# Patient Record
Sex: Female | Born: 1950
Health system: Southern US, Community
[De-identification: ages and names within clinical notes are randomized; demographics above are authoritative.]

## PROBLEM LIST (undated history)

## (undated) DIAGNOSIS — M1711 Unilateral primary osteoarthritis, right knee: Secondary | ICD-10-CM

## (undated) DIAGNOSIS — Z87442 Personal history of urinary calculi: Secondary | ICD-10-CM

## (undated) DIAGNOSIS — C801 Malignant (primary) neoplasm, unspecified: Secondary | ICD-10-CM

## (undated) DIAGNOSIS — J189 Pneumonia, unspecified organism: Secondary | ICD-10-CM

## (undated) DIAGNOSIS — K219 Gastro-esophageal reflux disease without esophagitis: Secondary | ICD-10-CM

## (undated) DIAGNOSIS — I4510 Unspecified right bundle-branch block: Secondary | ICD-10-CM

## (undated) DIAGNOSIS — R9431 Abnormal electrocardiogram [ECG] [EKG]: Secondary | ICD-10-CM

## (undated) DIAGNOSIS — I499 Cardiac arrhythmia, unspecified: Secondary | ICD-10-CM

## (undated) DIAGNOSIS — E039 Hypothyroidism, unspecified: Secondary | ICD-10-CM

## (undated) HISTORY — DX: Malignant (primary) neoplasm, unspecified: C80.1

## (undated) HISTORY — PX: POLYPECTOMY: SHX149

## (undated) HISTORY — PX: COLONOSCOPY: SHX174

## (undated) HISTORY — DX: Unilateral primary osteoarthritis, right knee: M17.11

## (undated) HISTORY — DX: Unspecified right bundle-branch block: I45.10

## (undated) HISTORY — DX: Abnormal electrocardiogram (ECG) (EKG): R94.31

## (undated) HISTORY — DX: Gastro-esophageal reflux disease without esophagitis: K21.9

## (undated) HISTORY — PX: KNEE ARTHROPLASTY: SHX992

## (undated) HISTORY — PX: UPPER GASTROINTESTINAL ENDOSCOPY: SHX188

---

## 2000-08-16 ENCOUNTER — Encounter: Admission: RE | Admit: 2000-08-16 | Discharge: 2000-08-16 | Payer: Self-pay | Admitting: Orthopedic Surgery

## 2000-08-16 ENCOUNTER — Encounter: Payer: Self-pay | Admitting: Orthopedic Surgery

## 2001-11-08 ENCOUNTER — Other Ambulatory Visit: Admission: RE | Admit: 2001-11-08 | Discharge: 2001-11-08 | Payer: Self-pay | Admitting: Obstetrics and Gynecology

## 2003-10-04 ENCOUNTER — Emergency Department (HOSPITAL_COMMUNITY): Admission: AD | Admit: 2003-10-04 | Discharge: 2003-10-04 | Payer: Self-pay | Admitting: Family Medicine

## 2004-08-03 HISTORY — PX: ABDOMINAL HYSTERECTOMY: SHX81

## 2005-12-16 ENCOUNTER — Encounter (INDEPENDENT_AMBULATORY_CARE_PROVIDER_SITE_OTHER): Payer: Self-pay | Admitting: *Deleted

## 2005-12-16 ENCOUNTER — Ambulatory Visit (HOSPITAL_COMMUNITY): Admission: RE | Admit: 2005-12-16 | Discharge: 2005-12-17 | Payer: Self-pay | Admitting: Obstetrics and Gynecology

## 2006-03-08 ENCOUNTER — Ambulatory Visit: Payer: Self-pay | Admitting: Gastroenterology

## 2006-05-04 ENCOUNTER — Encounter: Admission: RE | Admit: 2006-05-04 | Discharge: 2006-05-04 | Payer: Self-pay | Admitting: Obstetrics and Gynecology

## 2006-07-14 ENCOUNTER — Ambulatory Visit: Payer: Self-pay | Admitting: Gastroenterology

## 2006-07-22 ENCOUNTER — Ambulatory Visit: Payer: Self-pay | Admitting: Gastroenterology

## 2007-08-04 HISTORY — PX: TOTAL KNEE ARTHROPLASTY: SHX125

## 2007-10-26 ENCOUNTER — Encounter: Admission: RE | Admit: 2007-10-26 | Discharge: 2007-10-26 | Payer: Self-pay | Admitting: Family Medicine

## 2008-02-17 ENCOUNTER — Inpatient Hospital Stay (HOSPITAL_COMMUNITY): Admission: RE | Admit: 2008-02-17 | Discharge: 2008-02-20 | Payer: Self-pay | Admitting: Orthopaedic Surgery

## 2008-11-27 ENCOUNTER — Ambulatory Visit: Payer: Self-pay | Admitting: Licensed Clinical Social Worker

## 2008-12-04 ENCOUNTER — Ambulatory Visit: Payer: Self-pay | Admitting: Licensed Clinical Social Worker

## 2008-12-10 ENCOUNTER — Ambulatory Visit: Payer: Self-pay | Admitting: Licensed Clinical Social Worker

## 2008-12-17 ENCOUNTER — Ambulatory Visit: Payer: Self-pay | Admitting: Licensed Clinical Social Worker

## 2008-12-26 ENCOUNTER — Ambulatory Visit: Payer: Self-pay | Admitting: Licensed Clinical Social Worker

## 2009-01-02 ENCOUNTER — Ambulatory Visit: Payer: Self-pay | Admitting: Licensed Clinical Social Worker

## 2009-01-14 ENCOUNTER — Ambulatory Visit: Payer: Self-pay | Admitting: Licensed Clinical Social Worker

## 2009-01-30 ENCOUNTER — Ambulatory Visit: Payer: Self-pay | Admitting: Licensed Clinical Social Worker

## 2009-02-13 ENCOUNTER — Ambulatory Visit: Payer: Self-pay | Admitting: Licensed Clinical Social Worker

## 2009-05-24 ENCOUNTER — Encounter: Admission: RE | Admit: 2009-05-24 | Discharge: 2009-05-24 | Payer: Self-pay | Admitting: Internal Medicine

## 2010-02-24 ENCOUNTER — Encounter: Admission: RE | Admit: 2010-02-24 | Discharge: 2010-02-24 | Payer: Self-pay | Admitting: Internal Medicine

## 2010-06-05 ENCOUNTER — Encounter: Admission: RE | Admit: 2010-06-05 | Discharge: 2010-06-05 | Payer: Self-pay | Admitting: Internal Medicine

## 2010-06-20 ENCOUNTER — Encounter: Admission: RE | Admit: 2010-06-20 | Discharge: 2010-06-20 | Payer: Self-pay | Admitting: Internal Medicine

## 2010-12-16 NOTE — Op Note (Signed)
[name], [name]               ACCOUNT NO.:  [id number]   MEDICAL RECORD NO.:  [id number]          PATIENT TYPE:  INP   LOCATION:  5031                         FACILITY:  [facility]   PHYSICIAN:  Renee Haynes, M.D.    DATE OF BIRTH:  July 15, 1951   DATE OF PROCEDURE:  02/17/2008  DATE OF DISCHARGE:                               OPERATIVE REPORT   PREOPERATIVE DIAGNOSIS:  Right knee osteoarthritis.   POSTOPERATIVE DIAGNOSIS:  Right knee osteoarthritis.   PROCEDURE:  Right total knee arthroplasty, computer assist.   SURGEON:  Renee C. Ophelia Charter, MD   ASSISTANT:  Wende Neighbors, PA   ANESTHESIA:  GOT plus Marcaine skin local.   DRAINS:  One Hemovac.   COMPONENTS:  Implanted DePuy sigma #3 femur with lugs 2.5 tibia, 10-mm  insert, 35-mm all poly patella cemented, computer assist.   PROCEDURE:  After induction of general anesthesia with orotracheal  intubation, the patient had preoperative femoral nerve block.  Proximal  tourniquet was applied on lateral post heel bump for the knee  positioning in 90-degree flexion.  Standard prep and drape was performed  with DuraPrep.  Preoperative Ancef was given.  Extremity sheets, drapes,  split sheets, sterile skin marker, Betadine, and Biodrape was applied  and surgical time-out checklist was completed.  Midline incision was  made with medial parapatellar true retinacular incision.  Patella was  cut first removing 10 mm of bone with oscillating saw.  There were large  osteophytes on the femur and a large channel into the medial femoral  condyle with a prominent tied Renee.  Meniscus were resected.  There was  a very large medial parameniscal cyst present and this was opened and  ganglion fluid was removed.  Cyst as present at the lateral meniscus and  there was some calcification at the posterior horn of the medial  meniscus.  Placement of computer pins were performed with the femoral  pins placed in the incision and tibial pins outside.  These  taken  through range of motion for center of the hip and then sequential  imaging of the femur and then tibia curetting the models.  Ten mm were  resected off of the femur and then sizing for a #3.  Chamfer cuts were  made.  Box cut was made later.  The tibia was repaired and sized for 2.5  with computer orientation for appropriate cuts.  Keel cuts were made and  then trials inserted.  There was slightly more tightness on the medial  than lateral side.  Osteophytes were removed off the posterior aspect of  the femur with three-quarter curved osteotome and some of the posterior  capsule in the medial aspect was released.  The collateral ligaments  were saved and PCL was completely resected.  Cement was vacuum mixed.  Holes were drilled for the patella as well as the lug holes of the  femoral trial.  Tibia was cemented first.  Excessive cement was removed  with the Freer.  Next, the femur was cemented.  Insert placed and then  the patella clamped.  All excessive cements have  been removed.  There  was acute synovitis present throughout the knee, which was hemorrhagic  in appearance.  After cement was hard at 15 minutes, tourniquet was  deflated.  Hemostasis was obtained.  Hemovac drain was placed used an  trocar exiting superolaterally.  Deep retinaculum was closed with  interrupted figure-of-eight nonabsorbable sutures #1 and then 2-0 Vicryl  was placed in superficial retinaculum and  subcutaneous tissue.  Subcuticular skin closure,  tincture of Benzoin,  Steri-Strips, Marcaine infiltration, postop dressing, and knee  immobilizer was applied.  The patient tolerated the procedure well and  was transferred to recovery room in stable condition.      Renee Haynes, M.D.  Electronically Signed     MCY/MEDQ  D:  02/17/2008  T:  02/18/2008  Job:  [id number]

## 2010-12-19 NOTE — Op Note (Signed)
NAME:  Renee Haynes, Renee Haynes               ACCOUNT NO.:  [id number]   MEDICAL RECORD NO.:  [id number]          PATIENT TYPE:  AMB   LOCATION:  SDC                           FACILITY:  [facility]   PHYSICIAN:  Sherry A. Dickstein, M.D.DATE OF BIRTH:  Mar 24, 1951   DATE OF PROCEDURE:  12/16/2005  DATE OF DISCHARGE:                                 OPERATIVE REPORT   PREOPERATIVE DIAGNOSES:  1.  Cystocele.  2.  Vaginal prolapse.   POSTOPERATIVE DIAGNOSES:  1.  Cystocele.  2.  Vaginal prolapse.   PROCEDURE:  1.  Total vaginal hysterectomy.  2.  Anterior repair.  3.  Perineorrhaphy.   SURGEON:  Sherry A. Rosalio Macadamia, M.D. and Richardean Sale, M.D.   ANESTHESIA:  General   INDICATIONS:  This is a 60 year old G3, P 2-0-1-2 woman who has had a  significant vaginal prolapse over the past few years. This has been getting  worse such that the patient has a lot of vaginal pressure whenever she is  standing or lifting.  The patient is feeling her cervix and bladder  dropping.  The patient requests surgical intervention, at this time.  The  patient was found to have a third-degree cervical prolapse and a second-  degree cystocele.  Because of this, the patient is brought to the operating  room for vaginal hysterectomy and anterior repair.  The patient also  requests taking of the vaginal tissues at the time of surgery.   FINDINGS:  Normal sized anteflexed uterus, second-degree cystocele, third-  degree cervical prolapse.   DESCRIPTION OF PROCEDURE:  The patient is brought into the operating room,  given adequate general anesthesia; she was placed in dorsal lithotomy  position.  Her perineum and vagina were washed with Betadine.  She was  draped in a sterile fashion.  A weighted speculum was placed within the  vagina.  The cervix was grasped with [place] tenaculum.  The cervix was  infiltrated with 1% Xylocaine with 1:100,000 epinephrine.  The cervix was  circumcised and the vaginal mucosa was dissected  off of it. the posterior  cul-de-sac was identified and entered sharply.  The peritoneum was  identified.  The posterior cuff was opened.  Uterosacral ligaments were  clamped, cut, and suture ligated with #0 Vicryl ligature.  The posterior  cuff was then reefed with #0 Vicryl running locked stitch for adequate  hemostasis.   The beginning of the cardinal ligaments were clamped with ligatures and  cauterized x3 and cut.  The anterior cul-de-sac was identified with blunt  dissection to dissect the bladder off of the cervix.  The peritoneum was  identified and entered sharply.  The retractor was placed within this space.  Then, on alternating sides, the cardinal ligaments including the uterine  arteries were clamped and cauterized with LigaSure; each time the area was  cauterized x3 before it was cut.  The uterus was removed in this fashion.  There was a small amount of bleeding on the left side near the cardinal  ligaments.  This was suture ligated with #0 Vicryl figure-of-eight stitches.  Adequate hemostasis was obtained.  The perineum was identified and a suture was placed in a pursestring stitch  with #0 Vicryl.  This was not tied at this time.  The cul-de-sac stitch was  taken between the uterosacral ligaments, and across the posterior peritoneum  with #0 Vicryl to prevent a future enterocele.  This was tied; and then the  peritoneal stitch was then tied.  The vaginal cuff was then closed with #0  Vicryl in figure-of-eight stitches.  This was only closed up to just below  the uterosacral ligaments.  The anterior repair was then performed by  placing Allis clamps across the vaginal mucosa.  The vaginal mucosa was then  dissected off of the bladder with blunt and sharp dissection up to just  below the urethra.  A Kelly plication stitch was taken below the urethra  with 2-0 Vicryl in a mattress-type stitch for support of the urethra.   Attention was placed to make sure that the stitch  was not to through the  vaginal mucosa.  This was tied down for support of the urethra.  During the  surgery, a Foley catheter had been placed; and the Foley catheter was in  place when this stitch was tied down to make sure that there was not too  much tension across the urethra.  The bladder was then supported by taking 2-  0 Vicryl mattress-type stitches across the endopelvic fascia for support of  the bladder.  A shelf of tissue was then developed across the bladder for  support in this fashion.  The excess vaginal tissue was then dissected free  and the vaginal tissue was closed with #0 Vicryl in figure-of-eight  stitches.  The remaining vaginal tissues were closed throughout the vagina  in these figure-of-eight stitches; and the vaginal cuff was completely  closed.  The uterosacral stitches were tied together.   A small perineorrhaphy was performed by taking Allis clamps on the perineum,  cutting approximately 1.5 cm of tissue off. An incision was made across the  perineum in a triangular shape.  This tissue was dissected free.  A small  amount of tissue was dissected up underneath the vaginal mucosa.  Once these  tissues were dissected free, a #0 Vicryl stitch was taken into the levator  ani muscles to pull the tissues together.  This close to the vaginal opening  well.  The vaginal mucosa was then closed using 2-0 chromic in a  subcutaneous subcuticular closure.  Adequate hemostasis was present.  All  instruments had been removed from the vagina.  The patient was taken out of  the dorsal lithotomy position.  She was awakened.  She was extubated.  She  was moved from the operating room table to a stretcher in stable condition.  Complications were none.  Estimated blood loss 100 mL.  Specimen is uterus.      Sherry A. Rosalio Macadamia, M.D.  Electronically Signed    SAD/MEDQ  D:  12/16/2005  T:  12/16/2005  Job:  [id number]

## 2010-12-19 NOTE — Discharge Summary (Signed)
NAMEVALETTA, MULROY               ACCOUNT NO.:  [id number]   MEDICAL RECORD NO.:  [id number]          PATIENT TYPE:  INP   LOCATION:  5031                         FACILITY:  [facility]   PHYSICIAN:  Mark C. Ophelia Charter, M.D.    DATE OF BIRTH:  [birth year]   DATE OF ADMISSION:  02/17/2008  DATE OF DISCHARGE:  02/20/2008                               DISCHARGE SUMMARY   ADMISSION DIAGNOSES:  1. Osteoarthritis right knee.  2. Hypothyroidism   DISCHARGE DIAGNOSES:  1. Osteoarthritis right knee.  2. Hypothyroidism.  3. Posthemorrhagic anemia.   PROCEDURE:  On February 17, 2008, the patient underwent right total knee  arthroplasty computer-assisted performed by Dr. Ophelia Charter, assisted by  Maud Deed, [facility] under general anesthesia.   CONSULTATIONS:  None.   BRIEF HISTORY:  The patient is a 60 year old female with chronic and  progressive right knee pain.  Radiographs showing end-stage degenerative  joint disease of the right knee with near bone-on-bone deformity of the  medial compartment.  She had no relief with conservative treatment  including intra-articular steroid injections as well as viscous  supplementation.  She wished to proceed with surgical intervention and  was admitted for the procedure as stated above.   BRIEF HOSPITAL COURSE:  The patient tolerated the procedure under  general anesthesia without complications.  Postoperatively, she was  placed on Coumadin for DVT prophylaxis.  Adjustments in Coumadin dose  made according to daily protimes by the pharmacist at [facility].  INR on admission 0.9, INR at discharge was 1.8.  The patient was placed  on PCA analgesics initially and weaned to p.o. analgesics without  difficulty.  Foley catheter was discontinued on the second postoperative  day and she was able to void without difficulty.  Hemovac drain  discontinued on the first postoperative day and dressing changes done  daily thereafter.  No signs of infection during the hospital  stay of the  knee wound.  The patient utilized CPM machine for passive range of  motion and also received physical therapy for active range of motion,  stretching and strengthening exercises.  She was allowed weightbearing  as tolerated on the right lower extremity.  The patient was able to  ambulate as much as 100 feet with a rolling walker with minimal  assistance.  CPM machine was as high as 0 to 65 degrees.  The patient  demonstrated ability to go up and downstairs during the hospital stay as  well.  Admission labs showed hemoglobin 14.3, hematocrit 41.9.  Postoperatively, hemoglobin dropped to lowest value of 10.8 with  hematocrit 31.1.  Chemistry studies on admission within normal limits  with exception of glucose 101.  Repeat chemistries postoperatively with  values normal with exception of glucose 140.  Hemoglobin A1c 5.6 on February 18, 2008.  Urinalysis on admission with trace leukocyte esterase, few  epithelial cells and 0-2 WBCs per high power field.  Postoperative x-ray  of the right knee showed satisfactory appearance of right total knee  replacement.  EKG on admission sinus bradycardia, T-wave abnormality  consider anterior ischemia confirmed by Dr. Eldridge Dace.  PLAN:  The patient was discharged to her home.  Arrangements made for  appropriate durable medical equipment including CPM machine which she  will utilize as much as 8 hours daily.  Also a rolling walker and  elevated toilet seat made available for her.  She will receive home  health physical therapy for ambulation and gait training, weightbearing  as tolerated, as well as range of motion, stretching and strengthening  exercises of the right knee.  Dressing change to be done daily at home.  She will continue to use ice on the knee.  She will continue to use her  knee immobilizer until she is able to do a straight leg raise.  Follow  up with Dr. Ophelia Charter in 2 weeks.   Medications at discharge include  1. Tylox 1 to 2  every 4-6 hours as needed for pain.  2. Coumadin 1 mg daily.  3. Robaxin 500 mg one every 8 hours as needed for spasm.  She will      continue on Coumadin 1 mg daily with no need for daily pro- times.      Anticipate use of Coumadin for 4 weeks.   She will resume home medications as taken prior to admission with the  exception of Advil.  Medication reconciliation form given to the patient  with instructions.  All questions encouraged and answered.   CONDITION ON DISCHARGE:  Stable.      Wende Neighbors, P.A.      Mark C. Ophelia Charter, M.D.  Electronically Signed    SMV/MEDQ  D:  03/14/2008  T:  03/15/2008  Job:  [id number]

## 2011-03-18 ENCOUNTER — Other Ambulatory Visit (HOSPITAL_COMMUNITY): Payer: Managed Care, Other (non HMO)

## 2011-03-18 MED ORDER — GENTAMICIN IN SALINE 1.6-0.9 MG/ML-% IV SOLN: 80.0000 mg | Freq: Once | INTRAVENOUS | Status: DC

## 2011-03-19 ENCOUNTER — Encounter (HOSPITAL_COMMUNITY): Payer: Self-pay

## 2011-03-19 ENCOUNTER — Other Ambulatory Visit: Payer: Self-pay

## 2011-03-19 ENCOUNTER — Encounter (HOSPITAL_COMMUNITY)
Admission: RE | Admit: 2011-03-19 | Discharge: 2011-03-19 | Disposition: A | Discharge status: Home / Self Care | Payer: Managed Care, Other (non HMO) | Source: Ambulatory Visit | Attending: Obstetrics & Gynecology | Admitting: Obstetrics & Gynecology

## 2011-03-19 HISTORY — DX: Hypothyroidism, unspecified: E03.9

## 2011-03-19 MED ORDER — CLINDAMYCIN PHOSPHATE 900 MG/50ML IV SOLN
900.0000 mg | Freq: Once | INTRAVENOUS | Status: DC
  Filled 2011-03-19: qty 50

## 2011-03-19 MED ORDER — GENTAMICIN SULFATE 40 MG/ML IJ SOLN
120.0000 mg | Freq: Once | INTRAVENOUS | Status: DC
  Filled 2011-03-19: qty 3

## 2011-03-19 NOTE — Pre-Procedure Instructions (Signed)
EKG evaluated by Dr. Malen Gauze, MD., case to be canceled until cardiac clearance obtained. Pt informed, Dr. Camillia Herter office informed [facility]).

## 2011-03-19 NOTE — Patient Instructions (Addendum)
20 Renee Haynes  03/19/2011   Your procedure is scheduled on:  03/20/11  Enter through the Main Entrance of [facility] at 900 AM.  Pick up the phone at the desk and dial [phone].   Call this number if you have problems the morning of surgery: [phone]   Remember:   Do not eat food:After Midnight.  Do not drink clear liquids: After Midnight.  Take these medicines the morning of surgery with A SIP OF WATER: Synthroid    Do not wear jewelry, make-up or nail polish.  Do not wear lotions, powders, or perfumes. You may wear deodorant.  Do not shave 48 hours prior to surgery.  Do not bring valuables to the hospital.  Contacts, dentures or bridgework may not be worn into surgery.  Leave suitcase in the car. After surgery it may be brought to your room.  For patients admitted to the hospital, checkout time is 11:00 AM the day of discharge.   Patients discharged the day of surgery will not be allowed to drive home.  Name and phone number of your driver: NA  Special Instructions: CHG Shower Use Special Wash: 1/2 bottle night before surgery and 1/2 bottle morning of surgery.   Please read over the following fact sheets that you were given: Care and Recovery After Surgery

## 2011-03-20 ENCOUNTER — Encounter (HOSPITAL_COMMUNITY): Admission: RE | Payer: Self-pay | Source: Ambulatory Visit

## 2011-03-20 ENCOUNTER — Ambulatory Visit (HOSPITAL_COMMUNITY)
Admission: RE | Admit: 2011-03-20 | Payer: Managed Care, Other (non HMO) | Source: Ambulatory Visit | Admitting: Obstetrics & Gynecology

## 2011-03-20 SURGERY — ANTERIOR (CYSTOCELE) AND POSTERIOR REPAIR (RECTOCELE)
Anesthesia: General

## 2011-03-23 ENCOUNTER — Encounter: Payer: Self-pay | Admitting: Cardiology

## 2011-03-25 ENCOUNTER — Encounter: Payer: Self-pay | Admitting: Cardiology

## 2011-03-25 ENCOUNTER — Ambulatory Visit (INDEPENDENT_AMBULATORY_CARE_PROVIDER_SITE_OTHER): Payer: Managed Care, Other (non HMO) | Admitting: Cardiology

## 2011-03-25 DIAGNOSIS — R9431 Abnormal electrocardiogram [ECG] [EKG]: Secondary | ICD-10-CM

## 2011-03-25 DIAGNOSIS — R0989 Other specified symptoms and signs involving the circulatory and respiratory systems: Secondary | ICD-10-CM

## 2011-03-25 DIAGNOSIS — R0609 Other forms of dyspnea: Secondary | ICD-10-CM

## 2011-03-25 NOTE — Patient Instructions (Signed)
Start aspirin 81mg  daily--this should be enteric coated.  Schedule an appointment for a stress myoview. See instruction sheet.  Schedule an appointment for an echocardiogram.  Schedule an appointment with Dr Shirlee Latch in about 2 weeks after the testing has been completed.

## 2011-03-26 ENCOUNTER — Ambulatory Visit (HOSPITAL_COMMUNITY): Payer: Managed Care, Other (non HMO) | Attending: Cardiology | Admitting: Radiology

## 2011-03-26 DIAGNOSIS — I079 Rheumatic tricuspid valve disease, unspecified: Secondary | ICD-10-CM | POA: Insufficient documentation

## 2011-03-26 DIAGNOSIS — I379 Nonrheumatic pulmonary valve disorder, unspecified: Secondary | ICD-10-CM | POA: Insufficient documentation

## 2011-03-26 DIAGNOSIS — R0609 Other forms of dyspnea: Secondary | ICD-10-CM | POA: Insufficient documentation

## 2011-03-26 DIAGNOSIS — R5383 Other fatigue: Secondary | ICD-10-CM | POA: Insufficient documentation

## 2011-03-26 DIAGNOSIS — I498 Other specified cardiac arrhythmias: Secondary | ICD-10-CM | POA: Insufficient documentation

## 2011-03-26 DIAGNOSIS — R9431 Abnormal electrocardiogram [ECG] [EKG]: Secondary | ICD-10-CM | POA: Insufficient documentation

## 2011-03-26 DIAGNOSIS — R0989 Other specified symptoms and signs involving the circulatory and respiratory systems: Secondary | ICD-10-CM | POA: Insufficient documentation

## 2011-03-26 DIAGNOSIS — R5381 Other malaise: Secondary | ICD-10-CM | POA: Insufficient documentation

## 2011-03-26 DIAGNOSIS — R42 Dizziness and giddiness: Secondary | ICD-10-CM | POA: Insufficient documentation

## 2011-03-26 DIAGNOSIS — R0602 Shortness of breath: Secondary | ICD-10-CM

## 2011-03-26 MED ORDER — TECHNETIUM TC 99M TETROFOSMIN IV KIT
11.0000 | PACK | Freq: Once | INTRAVENOUS | Status: AC | PRN
  Administered 2011-03-26: 11 via INTRAVENOUS

## 2011-03-26 MED ORDER — TECHNETIUM TC 99M TETROFOSMIN IV KIT
33.0000 | PACK | Freq: Once | INTRAVENOUS | Status: AC | PRN
  Administered 2011-03-26: 33 via INTRAVENOUS
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
## 2011-03-26 NOTE — Assessment & Plan Note (Signed)
See above discussion

## 2011-03-26 NOTE — Assessment & Plan Note (Signed)
Patient's ECG was quite abnormal from yesterday with prominent anteroseptal T wave inversions and tall R waves in V1 and V2 suggestive of possible posterior MI.  Today's ECG shows significant improvement in the T wave inversions, suggesting lability. Patient is planning to undergo bladder surgery in the future.  She denies chest pain but has some dyspnea with moderate exertion.  Her ECG is concerning.  It is possible that it represents coronary disease, though she has minimal risk factors.  Another consideration would be a stress (takotsubo-type) cardiomyopathy, which could also give this pattern of ECG changes.  She has had some quite severe stress recently surrounding her divorce.   - Echo to assess LV systolic function.  - ETT-myoview to assess for ischemia.  - She should take ASA 81 mg daily until ischemia is ruled out.

## 2011-03-26 NOTE — Progress Notes (Signed)
[facility] SITE 3 NUCLEAR MED [address] [place] [address] [phone]  Cardiology Nuclear Med Study  Renee Haynes is a 60 y.o. female [id number] 1950-10-30   Nuclear Med Background Indication for Stress Test:  Evaluation for Ischemia and Abnormal EKG History:  No previous documented CAD Cardiac Risk Factors: none  Symptoms:  Dizziness, DOE and Fatigue   Nuclear Pre-Procedure Caffeine/Decaff Intake:  None NPO After: 7:30pm   Lungs:  clear IV 0.9% NS with Angio Cath:  20g  IV Site: R Antecubital  IV Started by:  Bonnita Levan, RN  Chest Size (in):  40 Cup Size: C  Height: 5\' 5"  (1.651 m)  Weight:  172 lb (78.019 kg)  BMI:  Body mass index is 28.62 kg/(m^2). Tech Comments:  N/A    Nuclear Med Study 1 or 2 day study: 1 day  Stress Test Type:  Stress  Reading MD: Cassell Clement, MD  Order Authorizing Provider:  D.McLean  Resting Radionuclide: Technetium 59m Tetrofosmin  Resting Radionuclide Dose: 11.0 mCi   Stress Radionuclide:  Technetium 71m Tetrofosmin  Stress Radionuclide Dose: 33.0 mCi           Stress Protocol Rest HR: 52 Stress HR: 141  Rest BP: 95/71 Stress BP: 176/91  Exercise Time (min): 7:30 METS: 9.30   Predicted Max HR: 158 bpm % Max HR: 89.24 bpm Rate Pressure Product: 41324   Dose of Adenosine (mg):  n/a Dose of Lexiscan: n/a mg  Dose of Atropine (mg): n/a Dose of Dobutamine: n/a mcg/kg/min (at max HR)  Stress Test Technologist: Milana Na, EMT-P  Nuclear Technologist:  Doyne Keel, CNMT     Rest Procedure:  Myocardial perfusion imaging was performed at rest 45 minutes following the intravenous administration of Technetium 38m Tetrofosmin. Rest ECG: Sinus Bradycardia  Stress Procedure:  The patient exercised for 7:30.  The patient stopped due to sob, fatigue, and denied any chest pain.  There were no significant ST-T wave changes, + sob, and fatigue.  Technetium 63m Tetrofosmin was injected at peak exercise and  myocardial perfusion imaging was performed after a brief delay. Stress ECG: With stress the anterior wall resting T wave abnormalities improve, then recur during recovery.  No ischemic ST depression.  QPS Raw Data Images:  Normal; no motion artifact; normal heart/lung ratio. Stress Images:  Normal homogeneous uptake in all areas of the myocardium. Rest Images:  Normal homogeneous uptake in all areas of the myocardium. Subtraction (SDS):  No evidence of ischemia. Transient Ischemic Dilatation (Normal <1.22):  0.94 Lung/Heart Ratio (Normal <0.45):  0.34  Quantitative Gated Spect Images QGS EDV:  64 ml QGS ESV:  13 ml QGS cine images:  NL LV Function; NL Wall Motion QGS EF: 80%  Impression Exercise Capacity:  Good exercise capacity. BP Response:  Normal blood pressure response. Clinical Symptoms:  No chest pain. ECG Impression:  No significant ST segment change suggestive of ischemia. Comparison with Prior Nuclear Study: No previous nuclear study performed  Overall Impression:  Normal stress nuclear study.  No ischemia.  Excellent LV systolic function.  Cassell Clement

## 2011-03-30 ENCOUNTER — Encounter: Payer: Self-pay | Admitting: *Deleted

## 2011-04-13 ENCOUNTER — Ambulatory Visit (INDEPENDENT_AMBULATORY_CARE_PROVIDER_SITE_OTHER): Payer: Managed Care, Other (non HMO) | Admitting: Cardiology

## 2011-04-13 ENCOUNTER — Encounter: Payer: Self-pay | Admitting: Cardiology

## 2011-04-13 VITALS — BP 119/77 | HR 59 | Ht 66.0 in | Wt 175.0 lb

## 2011-04-13 DIAGNOSIS — R9431 Abnormal electrocardiogram [ECG] [EKG]: Secondary | ICD-10-CM

## 2011-04-13 NOTE — Patient Instructions (Signed)
You do not need to schedule a follow-up appointment.
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
## 2011-04-15 NOTE — Assessment & Plan Note (Signed)
Renee Haynes's heart appears structurally normal by echo and there is no evidence for ischemia or infarction by myoview.  Though her ECG is abnormal, I suspect that this may be her baseline.  No further workup necessary.  Continue management of risk factors.

## 2011-05-01 LAB — APTT: aPTT: 25

## 2011-05-01 LAB — BASIC METABOLIC PANEL
BUN: 9
Chloride: 108
GFR calc Af Amer: >60
GFR calc non Af Amer: >60
Potassium: 4.3
Sodium: 138

## 2011-05-01 LAB — CBC
HCT: 34.4 — ABNORMAL LOW
HCT: 41.9
Hemoglobin: 10.8 — ABNORMAL LOW
Hemoglobin: 14.3
MCV: 86.8
Platelets: 118 — ABNORMAL LOW
Platelets: 163
RBC: 3.96
RDW: 14.4
WBC: 4.5
WBC: 6
WBC: 8.6

## 2011-05-01 LAB — DIFFERENTIAL
Basophils Relative: 1
Eosinophils Absolute: 0.1
Lymphocytes Relative: 31
Monocytes Absolute: 0.3
Monocytes Relative: 7

## 2011-05-01 LAB — URINE MICROSCOPIC-ADD ON

## 2011-05-01 LAB — COMPREHENSIVE METABOLIC PANEL
AST: 18
Albumin: 4.2
Calcium: 10
Chloride: 107
Glucose, Bld: 101 — ABNORMAL HIGH
Potassium: 4.5
Total Protein: 6.2

## 2011-05-01 LAB — URINALYSIS, ROUTINE W REFLEX MICROSCOPIC
Nitrite: NEGATIVE
Protein, ur: NEGATIVE
Urobilinogen, UA: 1
pH: 6.5

## 2011-05-01 LAB — PROTIME-INR
INR: 1.5
INR: 1.8 — ABNORMAL HIGH
Prothrombin Time: 13.5
Prothrombin Time: 22.1 — ABNORMAL HIGH

## 2011-05-01 LAB — HEMOGLOBIN A1C
Hgb A1c MFr Bld: 5.6
Mean Plasma Glucose: 122

## 2011-05-28 ENCOUNTER — Encounter (HOSPITAL_COMMUNITY): Payer: Self-pay

## 2011-05-28 ENCOUNTER — Encounter (HOSPITAL_COMMUNITY)
Admission: RE | Admit: 2011-05-28 | Discharge: 2011-05-28 | Disposition: A | Discharge status: Home / Self Care | Payer: Managed Care, Other (non HMO) | Source: Ambulatory Visit | Attending: Obstetrics & Gynecology | Admitting: Obstetrics & Gynecology

## 2011-05-28 LAB — CBC
MCH: 30.1 pg (ref 26.0–34.0)
MCHC: 34.4 g/dL (ref 30.0–36.0)
Platelets: 172 10*3/uL (ref 150–400)

## 2011-05-28 NOTE — Patient Instructions (Signed)
   Your procedure is scheduled on:06/10/11  Enter through the Main Entrance of [facility] at:0600 Pick up the phone at the desk and dial [phone] and inform us of your arrival.  Please call this number if you have any problems the morning of surgery: [phone]  Remember: Do not eat food after midnight:Tuesday Do not drink clear liquids after:midnight Tuesday Take these medicines the morning of surgery with a SIP OF WATER: Synthroid  Do not wear jewelry, make-up, or FINGER nail polish Do not wear lotions, powders, or perfumes.  You may wear deodorant. Do not shave 48 hours prior to surgery. Do not bring valuables to the hospital.  Leave suitcase in the car. After Surgery it may be brought to your room. For patients being admitted to the hospital, checkout time is 11:00am the day of discharge.  Patients discharged on the day of surgery will not be allowed to drive home.   Name and phone number of your driver:sonMordecai Maes- [phone]  Remember to use your hibiclens as instructed.Please shower with 1/2 bottle the evening before your surgery and the other 1/2 bottle the morning of surgery.

## 2011-05-29 ENCOUNTER — Other Ambulatory Visit: Payer: Self-pay | Admitting: Obstetrics & Gynecology

## 2011-06-09 MED ORDER — GENTAMICIN SULFATE 40 MG/ML IJ SOLN
INTRAVENOUS | Status: AC
  Administered 2011-06-10: 100 mL via INTRAVENOUS
  Filled 2011-06-09: qty 2.5

## 2011-06-09 NOTE — H&P (Addendum)
Renee Haynes is an 60 y.o. female. TVH and AP repair in 2007. 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 Here for rectocele repair. Prefers not to use any synthetic mesh.  G2P2, single at present. FamHx- breast cancer. Pt denies breast complaints, needs more current mammogram.   No LMP recorded. Patient is postmenopausal., s/p hysterectomy    Past Medical History  Diagnosis Date  . Hypothyroidism   . Abnormal EKG     recent w/u and neg echo  . Osteoarthritis of right knee     Past Surgical History  Procedure Date  . Abdominal hysterectomy 2006  . Joint replacement 2009    knee  . Knee arthroplasty     right    No family history on file.  Social History:  reports that she has never smoked. She does not have any smokeless tobacco history on file. She reports that she drinks alcohol. She reports that she does not use illicit drugs.  Allergies:  Allergies  Allergen Reactions  . Penicillins Cross Reactors Rash  . Sulfa Drugs Cross Reactors Rash    No prescriptions prior to admission    ROS  neg  There were no vitals taken for this visit. Physical Exam Physical exam:  A&O x 3, no acute distress. Pleasant HEENT neg, no thyromegaly Lungs CTA bilat CV S1S2 normal Abdo soft,non tender, non acute Extr no edema/ tenderness Pelvic s/p hysterectomy. Stage 3 rectocele noted , atrophic vaginitis.   No results found for this or any previous visit (from the past 24 hour(s)).  No results found.  Assessment/Plan: Posterior colpo-perineorrhaphy, avoid mesh use. Risks/complications including infection, bleeding, damage to rectum and recurrence of prolapse reviewed, esp since her job requites flying, constant lifting, patients understands and gives informed consent.   Travoris Bushey R 06/09/2011, 10:17 PM  Update--- H&P reviewed on 06/10/11 at 7.30 am, no new findings. Surgery reviewed,  consent obtained, patient agrees. No new changes. --V.Damyiah Moxley, MD 7.30 am, 06/10/11.

## 2011-06-10 ENCOUNTER — Encounter (HOSPITAL_COMMUNITY): Payer: Self-pay | Admitting: *Deleted

## 2011-06-10 ENCOUNTER — Ambulatory Visit (HOSPITAL_COMMUNITY): Payer: Managed Care, Other (non HMO) | Admitting: Anesthesiology

## 2011-06-10 ENCOUNTER — Encounter (HOSPITAL_COMMUNITY)
Admission: RE | Disposition: A | Discharge status: Home / Self Care | Payer: Self-pay | Source: Ambulatory Visit | Attending: Obstetrics & Gynecology

## 2011-06-10 ENCOUNTER — Encounter (HOSPITAL_COMMUNITY): Payer: Self-pay | Admitting: Anesthesiology

## 2011-06-10 ENCOUNTER — Ambulatory Visit (HOSPITAL_COMMUNITY)
Admission: RE | Admit: 2011-06-10 | Discharge: 2011-06-10 | Disposition: A | Discharge status: Home / Self Care | Payer: Managed Care, Other (non HMO) | Source: Ambulatory Visit | Attending: Obstetrics & Gynecology | Admitting: Obstetrics & Gynecology

## 2011-06-10 DIAGNOSIS — N993 Prolapse of vaginal vault after hysterectomy: Secondary | ICD-10-CM | POA: Insufficient documentation

## 2011-06-10 DIAGNOSIS — Z01812 Encounter for preprocedural laboratory examination: Secondary | ICD-10-CM | POA: Insufficient documentation

## 2011-06-10 DIAGNOSIS — Z01818 Encounter for other preprocedural examination: Secondary | ICD-10-CM | POA: Insufficient documentation

## 2011-06-10 DIAGNOSIS — N816 Rectocele: Secondary | ICD-10-CM | POA: Diagnosis present

## 2011-06-10 HISTORY — PX: RECTOCELE REPAIR: SHX761

## 2011-06-10 SURGERY — COLPORRHAPHY, POSTERIOR, FOR RECTOCELE REPAIR
Site: Vagina | Wound class: Clean Contaminated

## 2011-06-10 MED ORDER — MENTHOL 3 MG MT LOZG: 1.0000 | LOZENGE | OROMUCOSAL | Status: DC | PRN

## 2011-06-10 MED ORDER — PROMETHAZINE HCL 25 MG/ML IJ SOLN: 6.2500 mg | INTRAMUSCULAR | Status: DC | PRN

## 2011-06-10 MED ORDER — FENTANYL CITRATE 0.05 MG/ML IJ SOLN
INTRAMUSCULAR | Status: DC | PRN
  Administered 2011-06-10: 100 ug via INTRAVENOUS
  Administered 2011-06-10: 25 ug via INTRAVENOUS

## 2011-06-10 MED ORDER — LACTATED RINGERS IV SOLN
INTRAVENOUS | Status: DC
  Administered 2011-06-10 (×3): via INTRAVENOUS
  Administered 2011-06-10: 1000 mL via INTRAVENOUS

## 2011-06-10 MED ORDER — PROPOFOL 10 MG/ML IV EMUL
INTRAVENOUS | Status: DC | PRN
  Administered 2011-06-10: 150 mg via INTRAVENOUS

## 2011-06-10 MED ORDER — GLYCOPYRROLATE 0.2 MG/ML IJ SOLN
INTRAMUSCULAR | Status: AC
  Filled 2011-06-10: qty 1

## 2011-06-10 MED ORDER — LIDOCAINE HCL (CARDIAC) 20 MG/ML IV SOLN
INTRAVENOUS | Status: DC | PRN
  Administered 2011-06-10: 80 mg via INTRAVENOUS

## 2011-06-10 MED ORDER — ESTRADIOL 0.1 MG/GM VA CREA
TOPICAL_CREAM | VAGINAL | Status: DC | PRN
  Administered 2011-06-10: 1 via VAGINAL

## 2011-06-10 MED ORDER — FENTANYL CITRATE 0.05 MG/ML IJ SOLN: 25.0000 ug | INTRAMUSCULAR | Status: DC | PRN

## 2011-06-10 MED ORDER — EPHEDRINE SULFATE 50 MG/ML IJ SOLN
INTRAMUSCULAR | Status: AC
  Filled 2011-06-10: qty 1

## 2011-06-10 MED ORDER — MIDAZOLAM HCL 5 MG/5ML IJ SOLN
INTRAMUSCULAR | Status: DC | PRN
  Administered 2011-06-10: 1 mg via INTRAVENOUS

## 2011-06-10 MED ORDER — ACETAMINOPHEN 325 MG PO TABS: 325.0000 mg | ORAL_TABLET | ORAL | Status: DC | PRN

## 2011-06-10 MED ORDER — ALUM & MAG HYDROXIDE-SIMETH 200-200-20 MG/5ML PO SUSP: 30.0000 mL | ORAL | Status: DC | PRN

## 2011-06-10 MED ORDER — FENTANYL CITRATE 0.05 MG/ML IJ SOLN
INTRAMUSCULAR | Status: AC
  Filled 2011-06-10: qty 5

## 2011-06-10 MED ORDER — PROPOFOL 10 MG/ML IV EMUL
INTRAVENOUS | Status: AC
  Filled 2011-06-10: qty 20

## 2011-06-10 MED ORDER — OXYCODONE-ACETAMINOPHEN 5-325 MG PO TABS: 1.0000 | ORAL_TABLET | ORAL | Status: DC | PRN

## 2011-06-10 MED ORDER — MIDAZOLAM HCL 2 MG/2ML IJ SOLN
INTRAMUSCULAR | Status: AC
  Filled 2011-06-10: qty 2

## 2011-06-10 MED ORDER — KETOROLAC TROMETHAMINE 30 MG/ML IJ SOLN
INTRAMUSCULAR | Status: DC | PRN
  Administered 2011-06-10: 30 mg via INTRAVENOUS

## 2011-06-10 MED ORDER — ONDANSETRON HCL 4 MG/2ML IJ SOLN
INTRAMUSCULAR | Status: AC
  Filled 2011-06-10: qty 2

## 2011-06-10 MED ORDER — ONDANSETRON HCL 4 MG/2ML IJ SOLN
INTRAMUSCULAR | Status: DC | PRN
  Administered 2011-06-10: 4 mg via INTRAVENOUS

## 2011-06-10 MED ORDER — EPHEDRINE SULFATE 50 MG/ML IJ SOLN
INTRAMUSCULAR | Status: DC | PRN
  Administered 2011-06-10 (×2): 10 mg via INTRAVENOUS

## 2011-06-10 MED ORDER — KETOROLAC TROMETHAMINE 30 MG/ML IJ SOLN: 15.0000 mg | Freq: Once | INTRAMUSCULAR | Status: DC | PRN

## 2011-06-10 MED ORDER — DEXAMETHASONE SODIUM PHOSPHATE 10 MG/ML IJ SOLN
INTRAMUSCULAR | Status: AC
  Filled 2011-06-10: qty 1

## 2011-06-10 MED ORDER — BUPIVACAINE-EPINEPHRINE PF 0.25-1:200000 % IJ SOLN
INTRAMUSCULAR | Status: DC | PRN
  Administered 2011-06-10: 20 mL

## 2011-06-10 MED ORDER — LIDOCAINE HCL (CARDIAC) 20 MG/ML IV SOLN
INTRAVENOUS | Status: AC
  Filled 2011-06-10: qty 5

## 2011-06-10 MED ORDER — KETOROLAC TROMETHAMINE 30 MG/ML IJ SOLN: 30.0000 mg | Freq: Once | INTRAMUSCULAR | Status: DC

## 2011-06-10 MED ORDER — DEXAMETHASONE SODIUM PHOSPHATE 4 MG/ML IJ SOLN
INTRAMUSCULAR | Status: DC | PRN
  Administered 2011-06-10: 6 mg via INTRAVENOUS

## 2011-06-10 MED ORDER — GLYCOPYRROLATE 0.2 MG/ML IJ SOLN
INTRAMUSCULAR | Status: DC | PRN
  Administered 2011-06-10: 0.2 mg via INTRAVENOUS

## 2011-06-10 MED ORDER — KETOROLAC TROMETHAMINE 30 MG/ML IJ SOLN
INTRAMUSCULAR | Status: AC
  Filled 2011-06-10: qty 1

## 2011-06-10 SURGICAL SUPPLY — 28 items
BLADE SURG 15 STRL LF C SS BP (BLADE) ×2 IMPLANT
BLADE SURG 15 STRL SS (BLADE) ×1
CANISTER SUCTION 2500CC (MISCELLANEOUS) ×3 IMPLANT
CLOTH BEACON ORANGE TIMEOUT ST (SAFETY) ×3 IMPLANT
CONT PATH 16OZ SNAP LID 3702 (MISCELLANEOUS) IMPLANT
DECANTER SPIKE VIAL GLASS SM (MISCELLANEOUS) ×3 IMPLANT
DRAPE STERI URO 9X17 APER PCH (DRAPES) ×3 IMPLANT
DRAPE UTILITY XL STRL (DRAPES) ×3 IMPLANT
GAUZE PACKING 1 X5 YD ST (GAUZE/BANDAGES/DRESSINGS) IMPLANT
GAUZE PACKING 1/2 X5 YD (GAUZE/BANDAGES/DRESSINGS) ×3 IMPLANT
GAUZE SPONGE 4X4 16PLY XRAY LF (GAUZE/BANDAGES/DRESSINGS) ×3 IMPLANT
GLOVE BIO SURGEON STRL SZ7 (GLOVE) ×6 IMPLANT
GLOVE BIOGEL PI IND STRL 7.0 (GLOVE) ×4 IMPLANT
GLOVE BIOGEL PI INDICATOR 7.0 (GLOVE) ×2
GOWN PREVENTION PLUS LG XLONG (DISPOSABLE) ×12 IMPLANT
NEEDLE HYPO 22GX1.5 SAFETY (NEEDLE) ×3 IMPLANT
NS IRRIG 1000ML POUR BTL (IV SOLUTION) ×3 IMPLANT
PACK VAGINAL WOMENS (CUSTOM PROCEDURE TRAY) ×3 IMPLANT
RETRACTOR STAY HOOK 5MM (MISCELLANEOUS) IMPLANT
SUT VIC AB 0 CT1 27 (SUTURE)
SUT VIC AB 0 CT1 27XBRD ANBCTR (SUTURE) IMPLANT
SUT VIC AB 2-0 SH 27 (SUTURE) ×2
SUT VIC AB 2-0 SH 27XBRD (SUTURE) ×4 IMPLANT
SUT VIC AB 3-0 SH 27 (SUTURE) ×4
SUT VIC AB 3-0 SH 27X BRD (SUTURE) ×8 IMPLANT
TOWEL OR 17X24 6PK STRL BLUE (TOWEL DISPOSABLE) ×6 IMPLANT
TRAY FOLEY CATH 14FR (SET/KITS/TRAYS/PACK) ×3 IMPLANT
WATER STERILE IRR 1000ML POUR (IV SOLUTION) IMPLANT

## 2011-06-10 NOTE — Anesthesia Postprocedure Evaluation (Signed)
Anesthesia Post Note  Patient: Renee Haynes  Procedure(s) Performed:  POSTERIOR REPAIR (RECTOCELE)  Anesthesia type: General  Patient location: [facility]  Post pain: Pain level controlled  Post assessment: Post-op Vital signs reviewed  Last Vitals:  Filed Vitals:   06/10/11 1623  BP: 101/59  Pulse: 74  Temp: 36.6 C  Resp: 16    Post vital signs: Reviewed and stable  Level of consciousness: awake  Complications: No apparent anesthesia complications

## 2011-06-10 NOTE — Addendum Note (Signed)
Addendum  created 06/10/11 1648 by Cephus Shelling   Modules edited:Notes Section

## 2011-06-10 NOTE — Progress Notes (Signed)
Doing well, pack out, voided since foley out. Tolerated gen diet. Pain none. No active vag blding.  VS reviewed, stable. Abdo soft. Extr no c/c/e.  Plan- D/c home. F/up office 4 wks. Warning s/s reviewed.

## 2011-06-10 NOTE — Anesthesia Preprocedure Evaluation (Signed)
Anesthesia Evaluation  Patient identified by MRN, date of birth, ID band Patient awake    Reviewed: Allergy & Precautions, H&P , Patient's Chart, lab work & pertinent test results, reviewed documented beta blocker date and time   History of Anesthesia Complications Negative for: history of anesthetic complications  Airway Mallampati: II TM Distance: >3 FB Neck ROM: full    Dental No notable dental hx.    Pulmonary neg pulmonary ROS, shortness of breath,  clear to auscultation  Pulmonary exam normal       Cardiovascular Exercise Tolerance: Good neg cardio ROS regular Normal    Neuro/Psych Negative Neurological ROS  Negative Psych ROS   GI/Hepatic negative GI ROS, Neg liver ROS,   Endo/Other  Negative Endocrine ROSHypothyroidism   Renal/GU negative Renal ROS     Musculoskeletal   Abdominal   Peds  Hematology negative hematology ROS (+)   Anesthesia Other Findings Sinus bradycardia  Reproductive/Obstetrics negative OB ROS                           Anesthesia Physical Anesthesia Plan  ASA: II  Anesthesia Plan: General   Post-op Pain Management:    Induction:   Airway Management Planned:   Additional Equipment:   Intra-op Plan:   Post-operative Plan:   Informed Consent: I have reviewed the patients History and Physical, chart, labs and discussed the procedure including the risks, benefits and alternatives for the proposed anesthesia with the patient or authorized representative who has indicated his/her understanding and acceptance.   Dental Advisory Given  Plan Discussed with: CRNA and Surgeon  Anesthesia Plan Comments:         Anesthesia Quick Evaluation

## 2011-06-10 NOTE — Anesthesia Procedure Notes (Signed)
Procedure Name: LMA Insertion Date/Time: 06/10/2011 7:44 AM Performed by: Carlyle Lipa Pre-anesthesia Checklist: Patient identified, Emergency Drugs available, Suction available, Timeout performed and Patient being monitored Patient Re-evaluated:Patient Re-evaluated prior to inductionOxygen Delivery Method: Circle System Utilized Preoxygenation: Pre-oxygenation with 100% oxygen Intubation Type: IV induction Ventilation: Mask ventilation without difficulty LMA: LMA inserted LMA Size: 4.0 Grade View: Grade II Tube type: Oral Number of attempts: 1 Dental Injury: Teeth and Oropharynx as per pre-operative assessment

## 2011-06-10 NOTE — Transfer of Care (Signed)
Immediate Anesthesia Transfer of Care Note  Patient: Renee Haynes  Procedure(s) Performed:  POSTERIOR REPAIR (RECTOCELE)  Patient Location: PACU  Anesthesia Type: General  Level of Consciousness: awake, alert  and oriented  Airway & Oxygen Therapy: Patient Spontanous Breathing and Patient connected to nasal cannula oxygen  Post-op Assessment: Report given to PACU RN and Post -op Vital signs reviewed and stable  Post vital signs: Reviewed and stable  Complications: No apparent anesthesia complications

## 2011-06-10 NOTE — Discharge Summary (Signed)
Physician Discharge Summary  Patient ID: Renee Haynes MRN: [id number] DOB/AGE: [birth year] 60 y.o.  Admit date: 06/10/2011 Discharge date: 06/10/2011 Admission Diagnoses:  Rectocele grade III Discharge Diagnoses:  Same, Posterior colporrhaphy Discharged Condition: Improved  Hospital Course: Patient underwent surgery without complications. Post op recovery uncomplicated. Packing removed, voided, pain well controlled, tolerated general diet, ambulated. Vitals stable.    Discharge Exam: Blood pressure 103/63, pulse 71, temperature 98.5 F (36.9 C), temperature source Oral, resp. rate 18, height 5' 5.5" (1.664 m), weight 178 lb (80.74 kg), SpO2 91.00%.  Disposition: Home, stable.   Discharge Orders    Future Orders Please Complete By Expires   Diet - low sodium heart healthy      Increase activity slowly      Discharge instructions      Comments:   Ibuprofen 600 mg every 6- 8 hrs as needed for pain. Restart Aspirin after 3 days.   Call MD for:  temperature >100.4      Call MD for:  persistant nausea and vomiting      Call MD for:  severe uncontrolled pain      Call MD for:  redness, tenderness, or signs of infection (pain, swelling, redness, odor or green/yellow discharge around incision site)      Call MD for:  extreme fatigue      Call MD for:  persistant dizziness or light-headedness      Call MD for:  hives      Call MD for:  difficulty breathing, headache or visual disturbances        Current Discharge Medication List    CONTINUE these medications which have NOT CHANGED   Details  aspirin EC 81 MG tablet 81 mg daily. As of 05/21/11, pt has not had in a week, temporarily discontinued for surgery   Associated Diagnoses: Nonspecific abnormal electrocardiogram (ECG) (EKG); Other dyspnea and respiratory abnormality    levothyroxine (SYNTHROID, LEVOTHROID) 100 MCG tablet Take 100 mcg by mouth daily.     Multiple Vitamin (MULTIVITAMIN PO) Take 1 tablet by mouth daily.        zolpidem (AMBIEN) 10 MG tablet Take 10 mg by mouth at bedtime as needed. For sleep     estradiol (VAGIFEM) 25 MCG vaginal tablet Place 25 mcg vaginally 2 (two) times a week.        Follow-up Information    Follow up with Farhaan Mabee R in 4 weeks.   Contact information:   [address] [place] [address] [phone]         Signed: Robley Fries 06/10/2011, 6:28 PM

## 2011-06-10 NOTE — Anesthesia Postprocedure Evaluation (Signed)
  Anesthesia Post-op Note  Patient: Renee Haynes  Procedure(s) Performed:  POSTERIOR REPAIR (RECTOCELE)  Patient is awake and responsive. Pain and nausea are reasonably well controlled. Vital signs are stable and clinically acceptable. Oxygen saturation is clinically acceptable. There are no apparent anesthetic complications at this time. Patient is ready for discharge.

## 2011-06-10 NOTE — Op Note (Signed)
Preoperative diagnosis: Symptomatic rectocele stage III Postop diagnosis: Same Procedure: Posterior colporrhaphy Anesthesia: Gen. Via LMA Surgeon: Dr. Shea Evans Assistant: Dr Maxie Better IV fluids: LR 1700 cc Urine output: 400 cc clear in Foley EBL: 25 cc Complications: None Condition stable Disposition PACU, transferred to floor for extended observation Specimens none Procedure:  Patient is 60 year old with hysterectomy cystocele repair and perineorrhaphy history. Patient has symptomatic rectocele stage III. Surgical and nonsurgical options were reviewed and patient chose to proceed with surgery. Patient preferred not to use any synthetic material during repair. Risks and complications of surgery including infection bleeding damage to internal organs as well as rectal injury review. Also discussed with patient and recurrence of prolapse do to poor tissue review.  Informed written consent was obtained. Patient was brought to the operating room with IV running. She received clindamycin and gentamicin for preoperative antibiotic coverage. Patient underwent general anesthesia without difficulty. She was given dorsal lithotomy position. Partial prepped and draped in standard fashion. Time out was carried out and patient and surgery were confirmed. A Foley catheter was placed. The rectovaginal examination was performed to identify the extent of rectocele. The rectocele extended almost up to the vaginal vault. Allis clamps were applied at the hymenal ring. Patient's perineum was then approximated from prior surgery and did not require perineorrhaphy. As clamp was applied to the superior most aspect of rectocele. Infiltration was performed under vaginal tissue using 1% lidocaine with epinephrine. A small horizontal incision was made just at the hymenal ring two Allis clamps grasped at the edges of the incision. With the [facility] scissors vaginal incision was made after blunt dissection  undermining the vaginal layer up to the apex of rectocele. The cut edges of the vaginal incision were grasped with T. Clamps. Blunt and sharp dissection was performed to dissect vaginal wall from the underlying connective tissue and endopelvic fascia. Intermittent rectal exam was performed and no rectal injury was noted. After adequate dissection patient's endopelvic fascia was identified and lateral edges of the rectocele. Using 3-0 Vicryl the endopelvic fascia was sutured together from both edges to adequately create a rectovaginal septum. This was done in interrupted stitches of 3-0 Vicryl. Excess vaginal epithelium was excised. Vaginal cut edges were approximated using 2-0 Vicryl. Adequate closure was noted with reduction of rectocele as well as leaving adequate vaginal length and width. Estrace cream was applied in the vagina and a half inch packing tape was used to pack vaginal canal. Surgery was completed at this point in. Hemostasis was excellent. All counts were correct x2. Foley catheter was left for continuous drainage due to the vaginal packing Patient was in a supine, reversed from from anesthesia and brought to PACU. Patient will be admitted for extended observation on the floor. I performed the entire surgery and Dr Cherly Hensen was my assistant.  Hilary Hertz, MD.

## 2011-06-11 ENCOUNTER — Encounter (HOSPITAL_COMMUNITY): Payer: Self-pay | Admitting: Obstetrics & Gynecology

## 2011-06-30 ENCOUNTER — Other Ambulatory Visit: Payer: Self-pay | Admitting: Internal Medicine

## 2011-06-30 DIAGNOSIS — Z1231 Encounter for screening mammogram for malignant neoplasm of breast: Secondary | ICD-10-CM

## 2011-07-24 ENCOUNTER — Ambulatory Visit
Admission: RE | Admit: 2011-07-24 | Discharge: 2011-07-24 | Disposition: A | Discharge status: Home / Self Care | Payer: Managed Care, Other (non HMO) | Source: Ambulatory Visit | Attending: Internal Medicine | Admitting: Internal Medicine

## 2011-07-24 DIAGNOSIS — Z1231 Encounter for screening mammogram for malignant neoplasm of breast: Secondary | ICD-10-CM

## 2011-08-26 ENCOUNTER — Telehealth: Payer: Self-pay | Admitting: Cardiology

## 2011-08-26 NOTE — Telephone Encounter (Signed)
LOVx2,Echo,12 lead faxed to [facility] @ [phone] 08/26/11/KM

## 2011-10-29 ENCOUNTER — Ambulatory Visit: Payer: Self-pay | Admitting: Family Medicine

## 2011-10-29 VITALS — BP 126/84 | HR 66 | Temp 98.3°F | Resp 16 | Ht 64.5 in | Wt 184.0 lb

## 2011-10-29 DIAGNOSIS — S61209A Unspecified open wound of unspecified finger without damage to nail, initial encounter: Secondary | ICD-10-CM

## 2011-10-29 DIAGNOSIS — S61219A Laceration without foreign body of unspecified finger without damage to nail, initial encounter: Secondary | ICD-10-CM

## 2011-10-29 DIAGNOSIS — M79609 Pain in unspecified limb: Secondary | ICD-10-CM

## 2011-10-29 DIAGNOSIS — M79646 Pain in unspecified finger(s): Secondary | ICD-10-CM

## 2011-10-29 NOTE — Progress Notes (Signed)
   Patient ID: YOVANA SCOGIN MRN: [id number], DOB: 01/08/1951, 61 y.o. Date of Encounter: 10/29/2011, 5:06 PM   PROCEDURE NOTE: Verbal consent obtained. Sterile technique employed. Numbing: Anesthesia obtained with 2% plain lido caine 3 cc for digital block. Cleansed with soap and water. Irrigated. Betadine prep per usual protocol.  Wound explored, no deep structures involved, no foreign bodies.   Flexion and extension intact 2 point discrimination intact Full sensation Wound repaired with # 5 simple interrupted sutures 5-0 Prolene  Hemostasis obtained. Wound cleansed and dressed.  Splint applied Wound care instructions including precautions covered with patient. Handout given.  Anticipate suture removal in 10 days  Signed, Eula Listen, PA-C 10/29/2011 5:06 PM

## 2011-10-29 NOTE — Progress Notes (Signed)
  Subjective:    Patient ID: Renee Haynes, female    DOB: 02/14/1951, 61 y.o.   MRN: [id number]  HPI 61 yo female with finger laceration just prior to arrival.  Cleaning and cut middle finger, right hand on broken [place].  Last tetanus 1 year ago.     Review of Systems Negative except as per HPI     Objective:   Physical Exam  Constitutional: She appears well-developed.  Pulmonary/Chest: Effort normal.  Neurological: She is alert.   Dorsal surface of right 3rd digit, overlying PIP, 1.5 cm linear laceration.  Good extension strength       Assessment & Plan:  Finger laceration - repaired per above.

## 2011-11-09 ENCOUNTER — Ambulatory Visit (INDEPENDENT_AMBULATORY_CARE_PROVIDER_SITE_OTHER): Payer: Self-pay | Admitting: Physician Assistant

## 2011-11-09 VITALS — BP 123/81 | HR 56 | Temp 98.3°F | Resp 16 | Ht 65.0 in | Wt 185.8 lb

## 2011-11-09 DIAGNOSIS — Z4802 Encounter for removal of sutures: Secondary | ICD-10-CM

## 2011-11-09 NOTE — Progress Notes (Signed)
   Patient ID: LEDONNA DORMER MRN: [id number], DOB: 08/31/1950 61 y.o. Date of Encounter: 11/09/2011, 11:42 AM  Primary Physician: No primary provider on file.  Chief Complaint: Suture removal    See note from 10/29/11  HPI: 61 y.o. y/o female with injury to right 3rd finger Here for suture removal s/p placement on 10/29/11 Doing well No issues/complaints Afebrile/ No chills No erythema No pain Able to move without difficulty Normal sensation  Past Medical History  Diagnosis Date  . Hypothyroidism   . Abnormal EKG     recent w/u and neg echo  . Osteoarthritis of right knee      Home Meds: Prior to Admission medications   Medication Sig Start Date End Date Taking? Authorizing Provider  aspirin EC 81 MG tablet 81 mg daily. As of 05/21/11, pt has not had in a week, temporarily discontinued for surgery 03/25/11  Yes Laurey Morale, MD  estradiol (VAGIFEM) 25 MCG vaginal tablet Place 25 mcg vaginally 2 (two) times a week.    Yes Historical Provider, MD  levothyroxine (SYNTHROID, LEVOTHROID) 100 MCG tablet Take 100 mcg by mouth daily.    Yes Historical Provider, MD  Multiple Vitamin (MULTIVITAMIN PO) Take 1 tablet by mouth daily.     Yes Historical Provider, MD  zolpidem (AMBIEN) 10 MG tablet Take 10 mg by mouth at bedtime as needed. For sleep     Historical Provider, MD    Allergies:  Allergies  Allergen Reactions  . Penicillins Cross Reactors Rash  . Sulfa Drugs Cross Reactors Rash    Physical Exam: Blood pressure 123/81, pulse 56, temperature 98.3 F (36.8 C), temperature source Oral, resp. rate 16, height 5\' 5"  (1.651 m), weight 185 lb 12.8 oz (84.278 kg)., Body mass index is 30.92 kg/(m^2). General: Well developed, well nourished, in no acute distress. Head: Normocephalic, atraumatic, sclera non-icteric, no xanthomas, nares are without discharge.  Neck: Supple. Lungs: Breathing is unlabored. Heart: Normal rate. Msk:  Strength and tone appear normal for age. Wound:  Wound well healed without erythema, swelling, or tenderness to palpation. FROM and 5/5 strength with normal sensation throughout including 2 point discrimination Skin: See above, otherwise dry without rash or erythema. Extremities: No clubbing or cyanosis. No edema. Neuro: Alert and oriented X 3. Moves all extremities spontaneously.  Psych:  Responds to questions appropriately with a normal affect.   PROCEDURE: Verbal consent obtained. 5 sutures removed without difficulty.  Assessment and Plan: 61 y.o. y/o female here for suture removal for wound described above. -Sutures removed per above -Wound resolved -RTC prn  Signed, Eula Listen, PA-C 11/09/2011 11:42 AM

## 2012-06-27 ENCOUNTER — Other Ambulatory Visit: Payer: Self-pay | Admitting: Internal Medicine

## 2012-06-27 DIAGNOSIS — Z1231 Encounter for screening mammogram for malignant neoplasm of breast: Secondary | ICD-10-CM

## 2012-08-01 ENCOUNTER — Ambulatory Visit
Admission: RE | Admit: 2012-08-01 | Discharge: 2012-08-01 | Disposition: A | Discharge status: Home / Self Care | Payer: BC Managed Care – PPO | Source: Ambulatory Visit | Attending: Internal Medicine | Admitting: Internal Medicine

## 2012-08-01 DIAGNOSIS — Z1231 Encounter for screening mammogram for malignant neoplasm of breast: Secondary | ICD-10-CM

## 2013-05-17 ENCOUNTER — Encounter: Payer: Self-pay | Admitting: Gastroenterology

## 2013-12-01 DIAGNOSIS — Z803 Family history of malignant neoplasm of breast: Secondary | ICD-10-CM | POA: Diagnosis not present

## 2013-12-01 DIAGNOSIS — L293 Anogenital pruritus, unspecified: Secondary | ICD-10-CM | POA: Diagnosis not present

## 2013-12-01 DIAGNOSIS — H43819 Vitreous degeneration, unspecified eye: Secondary | ICD-10-CM | POA: Diagnosis not present

## 2013-12-01 DIAGNOSIS — L280 Lichen simplex chronicus: Secondary | ICD-10-CM | POA: Diagnosis not present

## 2013-12-06 ENCOUNTER — Encounter (INDEPENDENT_AMBULATORY_CARE_PROVIDER_SITE_OTHER): Payer: Medicare Other | Admitting: Ophthalmology

## 2013-12-06 DIAGNOSIS — H43819 Vitreous degeneration, unspecified eye: Secondary | ICD-10-CM | POA: Diagnosis not present

## 2013-12-06 DIAGNOSIS — H2510 Age-related nuclear cataract, unspecified eye: Secondary | ICD-10-CM | POA: Diagnosis not present

## 2013-12-06 DIAGNOSIS — H33309 Unspecified retinal break, unspecified eye: Secondary | ICD-10-CM

## 2013-12-15 DIAGNOSIS — L820 Inflamed seborrheic keratosis: Secondary | ICD-10-CM | POA: Diagnosis not present

## 2013-12-15 DIAGNOSIS — L919 Hypertrophic disorder of the skin, unspecified: Secondary | ICD-10-CM | POA: Diagnosis not present

## 2013-12-15 DIAGNOSIS — L918 Other hypertrophic disorders of the skin: Secondary | ICD-10-CM | POA: Diagnosis not present

## 2013-12-15 DIAGNOSIS — L821 Other seborrheic keratosis: Secondary | ICD-10-CM | POA: Diagnosis not present

## 2013-12-15 DIAGNOSIS — L259 Unspecified contact dermatitis, unspecified cause: Secondary | ICD-10-CM | POA: Diagnosis not present

## 2013-12-15 DIAGNOSIS — L908 Other atrophic disorders of skin: Secondary | ICD-10-CM | POA: Diagnosis not present

## 2013-12-15 DIAGNOSIS — L909 Atrophic disorder of skin, unspecified: Secondary | ICD-10-CM | POA: Diagnosis not present

## 2013-12-15 DIAGNOSIS — Z85828 Personal history of other malignant neoplasm of skin: Secondary | ICD-10-CM | POA: Diagnosis not present

## 2013-12-18 ENCOUNTER — Ambulatory Visit (INDEPENDENT_AMBULATORY_CARE_PROVIDER_SITE_OTHER): Payer: Medicare Other | Admitting: Ophthalmology

## 2013-12-18 DIAGNOSIS — H33309 Unspecified retinal break, unspecified eye: Secondary | ICD-10-CM

## 2013-12-26 ENCOUNTER — Ambulatory Visit (INDEPENDENT_AMBULATORY_CARE_PROVIDER_SITE_OTHER): Payer: Medicare Other | Admitting: Ophthalmology

## 2013-12-26 DIAGNOSIS — H33309 Unspecified retinal break, unspecified eye: Secondary | ICD-10-CM

## 2013-12-29 ENCOUNTER — Ambulatory Visit (INDEPENDENT_AMBULATORY_CARE_PROVIDER_SITE_OTHER): Payer: Medicare Other | Admitting: Ophthalmology

## 2013-12-30 ENCOUNTER — Encounter: Payer: Self-pay | Admitting: Gastroenterology

## 2014-01-29 ENCOUNTER — Other Ambulatory Visit: Payer: Self-pay

## 2014-01-29 DIAGNOSIS — Z1231 Encounter for screening mammogram for malignant neoplasm of breast: Secondary | ICD-10-CM

## 2014-01-31 ENCOUNTER — Encounter: Payer: Self-pay | Admitting: Gastroenterology

## 2014-02-05 ENCOUNTER — Encounter (INDEPENDENT_AMBULATORY_CARE_PROVIDER_SITE_OTHER): Payer: Self-pay

## 2014-02-05 ENCOUNTER — Ambulatory Visit
Admission: RE | Admit: 2014-02-05 | Discharge: 2014-02-05 | Disposition: A | Discharge status: Home / Self Care | Payer: Medicare Other | Source: Ambulatory Visit

## 2014-02-05 DIAGNOSIS — Z1231 Encounter for screening mammogram for malignant neoplasm of breast: Secondary | ICD-10-CM

## 2014-03-09 DIAGNOSIS — E785 Hyperlipidemia, unspecified: Secondary | ICD-10-CM | POA: Diagnosis not present

## 2014-03-09 DIAGNOSIS — E039 Hypothyroidism, unspecified: Secondary | ICD-10-CM | POA: Diagnosis not present

## 2014-03-12 ENCOUNTER — Telehealth: Payer: Self-pay | Admitting: Cardiology

## 2014-03-12 NOTE — Telephone Encounter (Signed)
Pt calling to ask Dr Aundra Dubin and nurse if they could schedule the pt an OV and a EKG with blood work in October sometime, because the pt is going to have a face lift and neck lift done in [place] in November, and the surgeon there has requested this information be obtained before the surgery.  Informed pt that Dr Aundra Dubin and nurse are both out of the office this week, but I will send them a message to schedule this and follow-up with the pt thereafter.  Pt verbalized understanding and agrees with this plan.

## 2014-03-12 NOTE — Telephone Encounter (Signed)
That would be fine, can schedule appt in October.

## 2014-03-12 NOTE — Telephone Encounter (Signed)
New problem    Pt is having plastic surgery and need and EKG and labs done. Please call pt concerning this matter

## 2014-03-15 NOTE — Telephone Encounter (Signed)
Appt scheduled for October 15th, pt aware.

## 2014-03-16 DIAGNOSIS — Z23 Encounter for immunization: Secondary | ICD-10-CM | POA: Diagnosis not present

## 2014-03-16 DIAGNOSIS — R0789 Other chest pain: Secondary | ICD-10-CM | POA: Diagnosis not present

## 2014-03-16 DIAGNOSIS — F329 Major depressive disorder, single episode, unspecified: Secondary | ICD-10-CM | POA: Diagnosis not present

## 2014-03-16 DIAGNOSIS — Z1331 Encounter for screening for depression: Secondary | ICD-10-CM | POA: Diagnosis not present

## 2014-03-16 DIAGNOSIS — L821 Other seborrheic keratosis: Secondary | ICD-10-CM | POA: Diagnosis not present

## 2014-03-16 DIAGNOSIS — D1801 Hemangioma of skin and subcutaneous tissue: Secondary | ICD-10-CM | POA: Diagnosis not present

## 2014-03-16 DIAGNOSIS — E785 Hyperlipidemia, unspecified: Secondary | ICD-10-CM | POA: Diagnosis not present

## 2014-03-16 DIAGNOSIS — L819 Disorder of pigmentation, unspecified: Secondary | ICD-10-CM | POA: Diagnosis not present

## 2014-03-16 DIAGNOSIS — D239 Other benign neoplasm of skin, unspecified: Secondary | ICD-10-CM | POA: Diagnosis not present

## 2014-03-16 DIAGNOSIS — Z0000 Encounter for general adult medical examination without abnormal findings: Secondary | ICD-10-CM | POA: Diagnosis not present

## 2014-03-16 DIAGNOSIS — Z85828 Personal history of other malignant neoplasm of skin: Secondary | ICD-10-CM | POA: Diagnosis not present

## 2014-03-16 DIAGNOSIS — Z6829 Body mass index (BMI) 29.0-29.9, adult: Secondary | ICD-10-CM | POA: Diagnosis not present

## 2014-03-16 DIAGNOSIS — L723 Sebaceous cyst: Secondary | ICD-10-CM | POA: Diagnosis not present

## 2014-03-16 DIAGNOSIS — R209 Unspecified disturbances of skin sensation: Secondary | ICD-10-CM | POA: Diagnosis not present

## 2014-03-16 DIAGNOSIS — F3289 Other specified depressive episodes: Secondary | ICD-10-CM | POA: Diagnosis not present

## 2014-03-16 DIAGNOSIS — G4700 Insomnia, unspecified: Secondary | ICD-10-CM | POA: Diagnosis not present

## 2014-03-16 DIAGNOSIS — E039 Hypothyroidism, unspecified: Secondary | ICD-10-CM | POA: Diagnosis not present

## 2014-03-20 DIAGNOSIS — Z1212 Encounter for screening for malignant neoplasm of rectum: Secondary | ICD-10-CM | POA: Diagnosis not present

## 2014-04-23 ENCOUNTER — Ambulatory Visit (AMBULATORY_SURGERY_CENTER): Payer: Self-pay | Admitting: *Deleted

## 2014-04-23 VITALS — Ht 66.0 in | Wt 179.2 lb

## 2014-04-23 DIAGNOSIS — Z86010 Personal history of colonic polyps: Secondary | ICD-10-CM

## 2014-04-23 MED ORDER — NA SULFATE-K SULFATE-MG SULF 17.5-3.13-1.6 GM/177ML PO SOLN: ORAL | Status: DC

## 2014-04-23 NOTE — Progress Notes (Signed)
No allergies to eggs or soy. No problems with anesthesia.  Pt given Emmi instructions for colonoscopy  No oxygen use  No diet drug use  

## 2014-04-24 DIAGNOSIS — Z23 Encounter for immunization: Secondary | ICD-10-CM | POA: Diagnosis not present

## 2014-04-26 DIAGNOSIS — L821 Other seborrheic keratosis: Secondary | ICD-10-CM | POA: Diagnosis not present

## 2014-04-26 DIAGNOSIS — Z85828 Personal history of other malignant neoplasm of skin: Secondary | ICD-10-CM | POA: Diagnosis not present

## 2014-04-26 DIAGNOSIS — L820 Inflamed seborrheic keratosis: Secondary | ICD-10-CM | POA: Diagnosis not present

## 2014-04-26 DIAGNOSIS — L738 Other specified follicular disorders: Secondary | ICD-10-CM | POA: Diagnosis not present

## 2014-05-01 ENCOUNTER — Ambulatory Visit (AMBULATORY_SURGERY_CENTER): Payer: Medicare Other | Admitting: Gastroenterology

## 2014-05-01 ENCOUNTER — Encounter: Payer: Self-pay | Admitting: Gastroenterology

## 2014-05-01 VITALS — BP 110/75 | HR 64 | Temp 97.4°F | Resp 18 | Ht 66.0 in | Wt 179.0 lb

## 2014-05-01 DIAGNOSIS — Z86010 Personal history of colonic polyps: Secondary | ICD-10-CM

## 2014-05-01 DIAGNOSIS — Z1211 Encounter for screening for malignant neoplasm of colon: Secondary | ICD-10-CM | POA: Diagnosis not present

## 2014-05-01 DIAGNOSIS — D126 Benign neoplasm of colon, unspecified: Secondary | ICD-10-CM

## 2014-05-01 DIAGNOSIS — K219 Gastro-esophageal reflux disease without esophagitis: Secondary | ICD-10-CM | POA: Diagnosis not present

## 2014-05-01 DIAGNOSIS — K5730 Diverticulosis of large intestine without perforation or abscess without bleeding: Secondary | ICD-10-CM

## 2014-05-01 DIAGNOSIS — K648 Other hemorrhoids: Secondary | ICD-10-CM

## 2014-05-01 MED ORDER — SODIUM CHLORIDE 0.9 % IV SOLN: 500.0000 mL | INTRAVENOUS | Status: DC

## 2014-05-01 NOTE — Patient Instructions (Addendum)
YOU HAD AN ENDOSCOPIC PROCEDURE TODAY AT THE [facility]: Refer to the procedure report that was given to you for any specific questions about what was found during the examination.  If the procedure report does not answer your questions, please call your gastroenterologist to clarify.  If you requested that your care partner not be given the details of your procedure findings, then the procedure report has been included in a sealed envelope for you to review at your convenience later.  YOU SHOULD EXPECT: Some feelings of bloating in the abdomen. Passage of more gas than usual.  Walking can help get rid of the air that was put into your GI tract during the procedure and reduce the bloating. If you had a lower endoscopy (such as a colonoscopy or flexible sigmoidoscopy) you may notice spotting of blood in your stool or on the toilet paper. If you underwent a bowel prep for your procedure, then you may not have a normal bowel movement for a few days.  DIET: Your first meal following the procedure should be a light meal and then it is ok to progress to your normal diet.  A half-sandwich or bowl of soup is an example of a good first meal.  Heavy or fried foods are harder to digest and may make you feel nauseous or bloated.  Likewise meals heavy in dairy and vegetables can cause extra gas to form and this can also increase the bloating.  Drink plenty of fluids but you should avoid alcoholic beverages for 24 hours.  ACTIVITY: Your care partner should take you home directly after the procedure.  You should plan to take it easy, moving slowly for the rest of the day.  You can resume normal activity the day after the procedure however you should NOT DRIVE or use heavy machinery for 24 hours (because of the sedation medicines used during the test).    SYMPTOMS TO REPORT IMMEDIATELY: A gastroenterologist can be reached at any hour.  During normal business hours, 8:30 AM to 5:00 PM Monday through Friday,  call [phone].  After hours and on weekends, please call the GI answering service at [phone] who will take a message and have the physician on call contact you.   Following lower endoscopy (colonoscopy or flexible sigmoidoscopy):  Excessive amounts of blood in the stool  Significant tenderness or worsening of abdominal pains  Swelling of the abdomen that is new, acute  Fever of 100F or higher    FOLLOW UP: If any biopsies were taken you will be contacted by phone or by letter within the next 1-3 weeks.  Call your gastroenterologist if you have not heard about the biopsies in 3 weeks.  Our staff will call the home number listed on your records the next business day following your procedure to check on you and address any questions or concerns that you may have at that time regarding the information given to you following your procedure. This is a courtesy call and so if there is no answer at the home number and we have not heard from you through the emergency physician on call, we will assume that you have returned to your regular daily activities without incident.  SIGNATURES/CONFIDENTIALITY: You and/or your care partner have signed paperwork which will be entered into your electronic medical record.  These signatures attest to the fact that that the information above on your After Visit Summary has been reviewed and is understood.  Full responsibility of the confidentiality   of this discharge information lies with you and/or your care-partner.   Information on polyps,diverticulosis &high fiber diet  given to you today

## 2014-05-01 NOTE — Progress Notes (Signed)
Patient awakening,vss,report to rn 

## 2014-05-01 NOTE — Progress Notes (Signed)
Called to room to assist during endoscopic procedure.  Patient ID and intended procedure confirmed with present staff. Received instructions for my participation in the procedure from the performing physician.  

## 2014-05-01 NOTE — Op Note (Signed)
[facility]  [address] [place], [id number]   COLONOSCOPY PROCEDURE REPORT  PATIENT: Renee Haynes, Renee Haynes  MR#: [id number] BIRTHDATE: 02-18-1951 , 51  yrs. old GENDER: female ENDOSCOPIST: Inda Castle, MD REFERRED DQ:QIWL Ava, MD PROCEDURE DATE:  05/01/2014 PROCEDURE:   Colonoscopy with snare polypectomy First Screening Colonoscopy - Avg.  risk and is 50 yrs.  old or older - No.  Prior Negative Screening - Now for repeat screening. N/A  History of Adenoma - Now for follow-up colonoscopy & has been > or = to 3 yrs.  Yes hx of adenoma.  Has been 3 or more years since last colonoscopy.  Polyps Removed Today? Yes. ASA CLASS:   Class II INDICATIONS:high risk personal history of colonic polyps.2004, 2007 colonoscopy negative for polyps MEDICATIONS: Propofol 300 mg IV and Monitored anesthesia care  DESCRIPTION OF PROCEDURE:   After the risks benefits and alternatives of the procedure were thoroughly explained, informed consent was obtained.  The digital rectal exam revealed no abnormalities of the rectum.   The LB [id number]  endoscope was introduced through the anus and advanced to the cecum, which was identified by both the appendix and ileocecal valve. No adverse events experienced.   The quality of the prep was Suprep good  The instrument was then slowly withdrawn as the colon was fully examined.      COLON FINDINGS: There was moderate diverticulosis noted in the sigmoid colon and descending colon.   A sessile polyp measuring 8 mm in size was found at the cecum.  A polypectomy was performed with a cold snare.  The resection was complete, the polyp tissue was completely retrieved and sent to histology.   A sessile polyp measuring 2 mm in size was found in the proximal transverse colon. A polypectomy was performed with cold forceps.   Polyp(s) were found.   A sessile polyp measuring 10 mm in size was found at the hepatic flexure.  A polypectomy was  performed with a cold snare. The resection was complete, the polyp tissue was completely retrieved and sent to histology.   Internal hemorrhoids were found. Retroflexed views revealed no abnormalities. The time to cecum=6 minutes 0 seconds.  Withdrawal time=8 minutes 44 seconds.  The scope was withdrawn and the procedure completed. COMPLICATIONS: There were no immediate complications.  ENDOSCOPIC IMPRESSION: 1.   Moderate diverticulosis was noted in the sigmoid colon and descending colon 2.   Sessile polyp measuring 8 mm in size was found at the cecum; polypectomy was performed with a cold snare 3.   Sessile polyp measuring 2 mm in size was found in the proximal transverse colon; polypectomy was performed with cold forceps 4.   Sessile polyp measuring 10 mm in size was found at the hepatic flexure; polypectomy was performed with a cold snare 5.   Internal hemorrhoids  RECOMMENDATIONS: If the polyp(s) removed today are proven to be adenomatous (pre-cancerous) polyps, you will need a colonoscopy in 3 years. Otherwise you should continue to follow colorectal cancer screening guidelines for "routine risk" patients with a colonoscopy in 10 years.  You will receive a letter within 1-2 weeks with the results of your biopsy as well as final recommendations.  Please call my office if you have not received a letter after 3 weeks.  eSigned:  Inda Castle, MD 05/01/2014 11:20 AM   cc:   PATIENT NAME:  Renee Haynes, Renee Haynes MR#: [id number]

## 2014-05-02 ENCOUNTER — Telehealth: Payer: Self-pay | Admitting: *Deleted

## 2014-05-02 NOTE — Telephone Encounter (Signed)
  Follow up Call-  Call back number 05/01/2014 05/01/2014  Post procedure Call Back phone  # [phone]  Permission to leave phone message Yes Yes     Patient questions:  Do you have a fever, pain , or abdominal swelling? No. Pain Score  0 *  Have you tolerated food without any problems? Yes.    Have you been able to return to your normal activitie?yes  Do you have any questions about your discharge instructions: Diet   No. Medications  No. Follow up visit  No.  Do you have questions or concerns about your Care? No.  Actions: * If pain score is 4 or above: No action needed, pain <4.

## 2014-05-04 ENCOUNTER — Ambulatory Visit (INDEPENDENT_AMBULATORY_CARE_PROVIDER_SITE_OTHER): Payer: Medicare Other | Admitting: Ophthalmology

## 2014-05-04 DIAGNOSIS — H33302 Unspecified retinal break, left eye: Secondary | ICD-10-CM | POA: Diagnosis not present

## 2014-05-04 DIAGNOSIS — H43813 Vitreous degeneration, bilateral: Secondary | ICD-10-CM

## 2014-05-07 ENCOUNTER — Encounter: Payer: Self-pay | Admitting: Gastroenterology

## 2014-05-17 ENCOUNTER — Encounter: Payer: Self-pay | Admitting: *Deleted

## 2014-05-17 ENCOUNTER — Ambulatory Visit (INDEPENDENT_AMBULATORY_CARE_PROVIDER_SITE_OTHER): Payer: Medicare Other | Admitting: Cardiology

## 2014-05-17 VITALS — BP 124/64 | HR 73 | Ht 66.0 in | Wt 180.0 lb

## 2014-05-17 DIAGNOSIS — R9431 Abnormal electrocardiogram [ECG] [EKG]: Secondary | ICD-10-CM | POA: Diagnosis not present

## 2014-05-17 NOTE — Patient Instructions (Signed)
Your physician wants you to follow-up in: 2 years with Dr Aundra Dubin. (October 2017).  You will receive a reminder letter in the mail two months in advance. If you don't receive a letter, please call our office to schedule the follow-up appointment.

## 2014-05-18 NOTE — Progress Notes (Signed)
Patient ID: Renee Haynes, female   DOB: 03/18/1951, 63 y.o.   MRN: [id number] PCP: Dr. Dagmar Hait  63 yo presented initially for evaluation of abnormal ECG.  Patient had gone for pre-operative evaluation prior to bladder surgery and was found to have an abnormal ECG.  This showed prominent R waves in V1 and V2 that could be consistent with a posterior MI as well as anteroseptal T wave inversions. She had no history of cardiac problems.  I had her do an echo in 8/12, which showed EF 65-70% and normal diastolic function.  ETT-myoview in 8/12 showed average exercise tolerance and no evidence for ischemia or infarction.   She returns for followup today to get cardiac clearance for a cosmetic surgery in [place] (eyelid surgery).  She walks for exercise with no chest pain or exertional dyspnea.  She does yard work like [employer].   Mild dyspnea walking up steps.    ECG (03/24/11): NSR, prominent R's V1 and V2 possibly consistent with old posterior MI, anteroseptal T wave inversions ECG (03/25/11): Anteroseptal T wave inversions have almost resolved compared to yesterday.  ECG (10/15): NSR, anteroseptal T wave inversions, prominent R in V1 and V2.    PMH: 1. Hypothyroidism 2. Right TKR in 2009 3. GERD 4. Abnormal ECG: Echo (8/12) with EF 83-33%, normal diastolic function, normal valves.  ETT-myoview (8/12) with 7'30" exercise, no ischemic ST changes, EF 70%, no evidence for ischemia or infarction.   SH: Lives in [place].  Divorced flight attendant.  Nonsmoker, rare ETOH.   FH: Grandfather with MI at 45, father with ETOH cirrhosis, mother died at 57, sister with breast cancer   Current Outpatient Prescriptions  Medication Sig Dispense Refill  . aspirin EC 81 MG tablet 81 mg daily. As of 05/21/11, pt has not had in a week, temporarily discontinued for surgery      . levothyroxine (SYNTHROID, LEVOTHROID) 100 MCG tablet Take 100 mcg by mouth daily.       . Multiple Vitamin (MULTIVITAMIN PO) Take 1 tablet by  mouth as needed.       . zolpidem (AMBIEN) 10 MG tablet Take 10 mg by mouth at bedtime as needed. For sleep        No current facility-administered medications for this visit.    BP 124/64  Pulse 73  Ht 5\' 6"  (1.676 m)  Wt 180 lb (81.647 kg)  BMI 29.07 kg/m2 General: NAD Neck: No JVD, no thyromegaly or thyroid nodule.  Lungs: Clear to auscultation bilaterally with normal respiratory effort. CV: Nondisplaced PMI.  Heart regular S1/S2, no S3/S4, no murmur.  No peripheral edema.  No carotid bruit.  Normal pedal pulses.  Abdomen: Soft, nontender, no hepatosplenomegaly, no distention.  Neurologic: Alert and oriented x 3.  Psych: Normal affect. Extremities: No clubbing or cyanosis.   Assessment/Plan: 1. Abnormal ECG: Echo and Cardiolite unremarkable in 8/12.   2. Pre-surgery evaluation: Patient is planned to undergo a low risk cosmetic surgery.  She can undergo surgery without further workup.    Renee Haynes 05/18/2014

## 2014-05-28 DIAGNOSIS — E785 Hyperlipidemia, unspecified: Secondary | ICD-10-CM | POA: Diagnosis not present

## 2014-05-28 DIAGNOSIS — Z008 Encounter for other general examination: Secondary | ICD-10-CM | POA: Diagnosis not present

## 2014-05-29 DIAGNOSIS — H01001 Unspecified blepharitis right upper eyelid: Secondary | ICD-10-CM | POA: Diagnosis not present

## 2014-05-29 DIAGNOSIS — H04123 Dry eye syndrome of bilateral lacrimal glands: Secondary | ICD-10-CM | POA: Diagnosis not present

## 2014-05-29 DIAGNOSIS — H01004 Unspecified blepharitis left upper eyelid: Secondary | ICD-10-CM | POA: Diagnosis not present

## 2014-05-31 DIAGNOSIS — H01003 Unspecified blepharitis right eye, unspecified eyelid: Secondary | ICD-10-CM | POA: Diagnosis not present

## 2014-06-11 DIAGNOSIS — H01004 Unspecified blepharitis left upper eyelid: Secondary | ICD-10-CM | POA: Diagnosis not present

## 2014-06-11 DIAGNOSIS — H04123 Dry eye syndrome of bilateral lacrimal glands: Secondary | ICD-10-CM | POA: Diagnosis not present

## 2014-06-11 DIAGNOSIS — H01001 Unspecified blepharitis right upper eyelid: Secondary | ICD-10-CM | POA: Diagnosis not present

## 2014-07-16 DIAGNOSIS — Z1389 Encounter for screening for other disorder: Secondary | ICD-10-CM | POA: Diagnosis not present

## 2014-07-16 DIAGNOSIS — Z6829 Body mass index (BMI) 29.0-29.9, adult: Secondary | ICD-10-CM | POA: Diagnosis not present

## 2014-07-16 DIAGNOSIS — F329 Major depressive disorder, single episode, unspecified: Secondary | ICD-10-CM | POA: Diagnosis not present

## 2014-07-16 DIAGNOSIS — E039 Hypothyroidism, unspecified: Secondary | ICD-10-CM | POA: Diagnosis not present

## 2014-10-11 ENCOUNTER — Telehealth: Payer: Self-pay | Admitting: Cardiology

## 2014-10-11 NOTE — Telephone Encounter (Signed)
Spoke with pt and told her we had not received fax. She reports it was faxed at 3:30 and 4:30 today.  Pt reports doctor in [place] is requesting recent EKG. Was done on May 17, 2014 but doctor needs more recent EKG.  Pt needs to have this done tomorrow or Monday due to travel plans.  Will await fax to make sure doctor is only requesting EKG. I told pt we would contact her tomorrow to schedule appt.

## 2014-10-11 NOTE — Telephone Encounter (Signed)
New Message       Pt calling stating that her Doctor in [place] faxed a form for pt to have an EKG done prior to a procedure. Pt states she needs to have this done within the next 2 days. Please call pt back and let her know if we have received the fax and to schedule EKG. Please call back and advise.

## 2014-10-12 DIAGNOSIS — E785 Hyperlipidemia, unspecified: Secondary | ICD-10-CM | POA: Diagnosis not present

## 2014-10-12 NOTE — Telephone Encounter (Signed)
Dr Nance Pew have received a fax order requesting an EKG to be done here and faxed to Dr Mancel Bale in [place], [place]  prior to face left,upper and lower blepharoplasty.  Is it [facility] for me to schedule the EKG to be done here and faxed to Dr Mancel Bale?

## 2014-10-12 NOTE — Telephone Encounter (Signed)
That would be fine 

## 2014-10-15 ENCOUNTER — Encounter: Payer: Self-pay | Admitting: *Deleted

## 2014-10-15 ENCOUNTER — Ambulatory Visit (INDEPENDENT_AMBULATORY_CARE_PROVIDER_SITE_OTHER): Payer: Medicare Other | Admitting: *Deleted

## 2014-10-15 VITALS — BP 128/70 | HR 66 | Wt 178.1 lb

## 2014-10-15 DIAGNOSIS — R9431 Abnormal electrocardiogram [ECG] [EKG]: Secondary | ICD-10-CM

## 2014-10-15 NOTE — Patient Instructions (Addendum)
Preoperative EKG showed normal rhythm.  Surgery scheduled for March 22 in [place]  Follow up as scheduled with Dr. Aundra Dubin

## 2014-10-15 NOTE — Telephone Encounter (Signed)
EKG done and read by Dr Rayann Heman. Request for EKG and surgical clearance information from Dr Mancel Bale in [place] returned to HIM.

## 2014-10-15 NOTE — Progress Notes (Signed)
1.) Reason for visit: EKG as preop pending cosmetic surgery on October 23, 2014  2.) Name of MD requesting visit: Dr. [name]  3.) H&P: Abnormal EKG in past  4.) ROS related to problem: No c/o today.  States she is ready to have this surgery done.  Is flying out to [place] [employer]. EKG showed NSR and was reviewed by Dr. Rayann Heman (DOD).  Will give EKG to Eliot Ford (Dr.McLean's nurse) to fax all information to physician in [place].  Will forward to Dr. Rayann Heman for review

## 2014-10-15 NOTE — Telephone Encounter (Signed)
Included in information returned to HIM with surgical clearance request was copy of EKG done today and copy of Dr Claris Gladden 05/18/14 office note that notes she can undergo planned cosmetic surgery without further work up.

## 2014-10-15 NOTE — Telephone Encounter (Signed)
Pt advised 10/12/14 EKG scheduled for today at [facility].

## 2014-10-16 ENCOUNTER — Telehealth: Payer: Self-pay | Admitting: Cardiology

## 2014-10-16 NOTE — Telephone Encounter (Signed)
I called and spoke with Mendel Ryder at Dr. Mancel Bale office in [place]- [phone].  I advised her the patient came in yesterday for a pre-op EKG and that her paperwork is pending sign off by Dr. Aundra Dubin. I made her aware Dr. Aundra Dubin will be back in the office on Thursday this week and we should be able to send her clearance to them at that time. Mendel Ryder is agreeable.

## 2014-10-16 NOTE — Telephone Encounter (Signed)
New Msg      Mendel Ryder calling from Dr. Barnie Alderman office.  States fax was sent last week about cardiac clearance for pt.   Has it been received? Please contact office at [phone].

## 2014-10-19 NOTE — Telephone Encounter (Signed)
Dr Aundra Dubin completed surgical clearance form-OK for surgery--returned signed, completed form to HIM.

## 2014-12-12 DIAGNOSIS — K219 Gastro-esophageal reflux disease without esophagitis: Secondary | ICD-10-CM | POA: Diagnosis not present

## 2014-12-12 DIAGNOSIS — R0600 Dyspnea, unspecified: Secondary | ICD-10-CM | POA: Diagnosis not present

## 2014-12-12 DIAGNOSIS — Z6829 Body mass index (BMI) 29.0-29.9, adult: Secondary | ICD-10-CM | POA: Diagnosis not present

## 2014-12-26 DIAGNOSIS — H01004 Unspecified blepharitis left upper eyelid: Secondary | ICD-10-CM | POA: Diagnosis not present

## 2014-12-26 DIAGNOSIS — H01001 Unspecified blepharitis right upper eyelid: Secondary | ICD-10-CM | POA: Diagnosis not present

## 2015-01-29 DIAGNOSIS — H01001 Unspecified blepharitis right upper eyelid: Secondary | ICD-10-CM | POA: Diagnosis not present

## 2015-01-29 DIAGNOSIS — H01002 Unspecified blepharitis right lower eyelid: Secondary | ICD-10-CM | POA: Diagnosis not present

## 2015-01-29 DIAGNOSIS — H01004 Unspecified blepharitis left upper eyelid: Secondary | ICD-10-CM | POA: Diagnosis not present

## 2015-01-29 DIAGNOSIS — H01005 Unspecified blepharitis left lower eyelid: Secondary | ICD-10-CM | POA: Diagnosis not present

## 2015-04-24 DIAGNOSIS — N390 Urinary tract infection, site not specified: Secondary | ICD-10-CM | POA: Diagnosis not present

## 2015-04-24 DIAGNOSIS — R8290 Unspecified abnormal findings in urine: Secondary | ICD-10-CM | POA: Diagnosis not present

## 2015-04-29 DIAGNOSIS — E039 Hypothyroidism, unspecified: Secondary | ICD-10-CM | POA: Diagnosis not present

## 2015-04-29 DIAGNOSIS — N318 Other neuromuscular dysfunction of bladder: Secondary | ICD-10-CM | POA: Diagnosis not present

## 2015-04-29 DIAGNOSIS — E785 Hyperlipidemia, unspecified: Secondary | ICD-10-CM | POA: Diagnosis not present

## 2015-05-06 DIAGNOSIS — Z23 Encounter for immunization: Secondary | ICD-10-CM | POA: Diagnosis not present

## 2015-05-06 DIAGNOSIS — F329 Major depressive disorder, single episode, unspecified: Secondary | ICD-10-CM | POA: Diagnosis not present

## 2015-05-06 DIAGNOSIS — R300 Dysuria: Secondary | ICD-10-CM | POA: Diagnosis not present

## 2015-05-06 DIAGNOSIS — E039 Hypothyroidism, unspecified: Secondary | ICD-10-CM | POA: Diagnosis not present

## 2015-05-06 DIAGNOSIS — Z0000 Encounter for general adult medical examination without abnormal findings: Secondary | ICD-10-CM | POA: Diagnosis not present

## 2015-05-06 DIAGNOSIS — E785 Hyperlipidemia, unspecified: Secondary | ICD-10-CM | POA: Diagnosis not present

## 2015-05-06 DIAGNOSIS — G4700 Insomnia, unspecified: Secondary | ICD-10-CM | POA: Diagnosis not present

## 2015-05-06 DIAGNOSIS — Z6829 Body mass index (BMI) 29.0-29.9, adult: Secondary | ICD-10-CM | POA: Diagnosis not present

## 2015-05-06 DIAGNOSIS — K219 Gastro-esophageal reflux disease without esophagitis: Secondary | ICD-10-CM | POA: Diagnosis not present

## 2015-05-24 ENCOUNTER — Other Ambulatory Visit: Payer: Self-pay

## 2015-05-24 DIAGNOSIS — Z1231 Encounter for screening mammogram for malignant neoplasm of breast: Secondary | ICD-10-CM

## 2015-06-04 DIAGNOSIS — L700 Acne vulgaris: Secondary | ICD-10-CM | POA: Diagnosis not present

## 2015-06-04 DIAGNOSIS — L814 Other melanin hyperpigmentation: Secondary | ICD-10-CM | POA: Diagnosis not present

## 2015-06-04 DIAGNOSIS — Z85828 Personal history of other malignant neoplasm of skin: Secondary | ICD-10-CM | POA: Diagnosis not present

## 2015-07-09 DIAGNOSIS — Z780 Asymptomatic menopausal state: Secondary | ICD-10-CM | POA: Diagnosis not present

## 2015-07-09 DIAGNOSIS — E038 Other specified hypothyroidism: Secondary | ICD-10-CM | POA: Diagnosis not present

## 2015-08-09 ENCOUNTER — Ambulatory Visit: Payer: Medicare Other

## 2015-08-26 ENCOUNTER — Ambulatory Visit: Payer: Medicare Other

## 2015-08-30 ENCOUNTER — Ambulatory Visit
Admission: RE | Admit: 2015-08-30 | Discharge: 2015-08-30 | Disposition: A | Discharge status: Home / Self Care | Payer: Medicare Other | Source: Ambulatory Visit

## 2015-08-30 DIAGNOSIS — Z1231 Encounter for screening mammogram for malignant neoplasm of breast: Secondary | ICD-10-CM

## 2015-09-07 ENCOUNTER — Ambulatory Visit (INDEPENDENT_AMBULATORY_CARE_PROVIDER_SITE_OTHER): Payer: Medicare Other | Admitting: Physician Assistant

## 2015-09-07 ENCOUNTER — Ambulatory Visit (INDEPENDENT_AMBULATORY_CARE_PROVIDER_SITE_OTHER): Payer: Medicare Other

## 2015-09-07 VITALS — BP 110/86 | HR 51 | Temp 97.6°F | Resp 18 | Ht 65.0 in | Wt 178.0 lb

## 2015-09-07 DIAGNOSIS — S60221A Contusion of right hand, initial encounter: Secondary | ICD-10-CM

## 2015-09-07 DIAGNOSIS — M79641 Pain in right hand: Secondary | ICD-10-CM

## 2015-09-07 NOTE — Patient Instructions (Signed)
Because you received an x-ray today, you will receive an invoice from [facility]. Please contact [facility] at [phone] with questions or concerns regarding your invoice. Our billing staff will not be able to assist you with those questions. °

## 2015-09-07 NOTE — Progress Notes (Signed)
[facility] [address] [place] [address] [phone]  Date:  09/07/2015   Name:  Renee Haynes   DOB:  [birth year]   MRN:  [id number]  PCP:  Tivis Ringer, MD    Chief Complaint: Hand Pain   History of Present Illness:  This is a 65 y.o. female who is presenting with right hand pain. 2 days ago tripped in closet over some shoes. Fell on hand with hand supinated. Has had some pain esp over 4th MCP. Is able to make a fist. Denies weakness or paresthesias. Has not taken anything for pain. She is left hand dominant.  Review of Systems:  Review of Systems See HPI  Patient Active Problem List   Diagnosis Date Noted  . Rectocele 06/10/2011  . Abnormal ECG 03/26/2011  . Dyspnea on exertion 03/26/2011    Prior to Admission medications   Medication Sig Start Date End Date Taking? Authorizing Provider  aspirin EC 81 MG tablet 81 mg daily. As of 05/21/11, pt has not had in a week, temporarily discontinued for surgery 03/25/11  Yes Larey Dresser, MD  levothyroxine (SYNTHROID, LEVOTHROID) 100 MCG tablet Take 100 mcg by mouth daily.    Yes Historical Provider, MD  Multiple Vitamin (MULTIVITAMIN PO) Take 1 tablet by mouth as needed.    Yes Historical Provider, MD  zolpidem (AMBIEN) 10 MG tablet Take 10 mg by mouth at bedtime as needed. Reported on 09/07/2015    Historical Provider, MD    Allergies  Allergen Reactions  . Penicillins Cross Reactors Rash  . Sulfa Drugs Cross Reactors Rash    Past Surgical History  Procedure Laterality Date  . Abdominal hysterectomy  2006  . Total knee arthroplasty Right 2009  . Knee arthroplasty Right   . Rectocele repair  06/10/2011    Procedure: POSTERIOR REPAIR (RECTOCELE);  Surgeon: Elveria Royals;  Location: [facility];  Service: Gynecology;  Laterality: N/A;    Social History  Substance Use Topics  . Smoking status: Never Smoker   . Smokeless tobacco: Never Used  . Alcohol Use: 1.2 oz/week    2 Glasses of wine per  week    Family History  Problem Relation Age of Onset  . Colon cancer Neg Hx     Medication list has been reviewed and updated.  Physical Examination:  Physical Exam  Constitutional: She is oriented to person, place, and time. She appears well-developed and well-nourished. No distress.  HENT:  Head: Normocephalic and atraumatic.  Right Ear: Hearing normal.  Left Ear: Hearing normal.  Nose: Nose normal.  Eyes: Conjunctivae and lids are normal. Right eye exhibits no discharge. Left eye exhibits no discharge. No scleral icterus.  Pulmonary/Chest: Effort normal. No respiratory distress.  Musculoskeletal:       Right hand: She exhibits tenderness (4th MCP) and bony tenderness. She exhibits normal range of motion and normal capillary refill. Normal sensation noted. Normal strength noted.       Left hand: Normal.  Pain with forced extension right 4th digit No scaphoid tenderness. No wrist tenderness. Mild erythema over dorsal hand  Neurological: She is alert and oriented to person, place, and time.  Skin: Skin is warm, dry and intact. No lesion and no rash noted.  Psychiatric: She has a normal mood and affect. Her speech is normal and behavior is normal. Thought content normal.   BP 110/86 mmHg  Pulse 51  Temp(Src) 97.6 F (36.4 C) (Oral)  Resp 18  Ht  5\' 5"  (1.651 m)  Wt 178 lb (80.74 kg)  BMI 29.62 kg/m2  SpO2 97%  Dg Hand Complete Right  09/07/2015  CLINICAL DATA:  RIGHT hand injury. Right hand injury/pain. 2 days ago tripped in closet. Att: 4-th finger. EXAM: RIGHT HAND - COMPLETE 3+ VIEW COMPARISON:  None. FINDINGS: No evidence of fracture of the carpal or metacarpal bones. Radiocarpal joint is intact. Phalanges are normal. No soft tissue injury. IMPRESSION: No fracture or dislocation. Electronically Signed   By: Suzy Bouchard M.D.   On: 09/07/2015 14:32    Assessment and Plan:  1. Hand contusion, right, initial encounter 2. Hand pain, right Radiograph negative.  Likely hand contusion. Counseled on RICE therapy. Return in 7-10 days if symptoms not improving. - DG Hand Complete Right; Future   Benjaman Pott. Drenda Freeze, MHS [facility]  09/07/2015

## 2015-09-18 DIAGNOSIS — L738 Other specified follicular disorders: Secondary | ICD-10-CM | POA: Diagnosis not present

## 2015-09-18 DIAGNOSIS — L570 Actinic keratosis: Secondary | ICD-10-CM | POA: Diagnosis not present

## 2015-09-18 DIAGNOSIS — L821 Other seborrheic keratosis: Secondary | ICD-10-CM | POA: Diagnosis not present

## 2015-09-18 DIAGNOSIS — L814 Other melanin hyperpigmentation: Secondary | ICD-10-CM | POA: Diagnosis not present

## 2015-09-18 DIAGNOSIS — D1801 Hemangioma of skin and subcutaneous tissue: Secondary | ICD-10-CM | POA: Diagnosis not present

## 2015-09-18 DIAGNOSIS — R208 Other disturbances of skin sensation: Secondary | ICD-10-CM | POA: Diagnosis not present

## 2015-09-18 DIAGNOSIS — Z85828 Personal history of other malignant neoplasm of skin: Secondary | ICD-10-CM | POA: Diagnosis not present

## 2015-09-18 DIAGNOSIS — L304 Erythema intertrigo: Secondary | ICD-10-CM | POA: Diagnosis not present

## 2015-11-01 ENCOUNTER — Encounter: Payer: Self-pay | Admitting: Gastroenterology

## 2015-12-04 DIAGNOSIS — Z85828 Personal history of other malignant neoplasm of skin: Secondary | ICD-10-CM | POA: Diagnosis not present

## 2015-12-04 DIAGNOSIS — L820 Inflamed seborrheic keratosis: Secondary | ICD-10-CM | POA: Diagnosis not present

## 2015-12-04 DIAGNOSIS — L308 Other specified dermatitis: Secondary | ICD-10-CM | POA: Diagnosis not present

## 2015-12-04 DIAGNOSIS — C44729 Squamous cell carcinoma of skin of left lower limb, including hip: Secondary | ICD-10-CM | POA: Diagnosis not present

## 2015-12-04 DIAGNOSIS — D485 Neoplasm of uncertain behavior of skin: Secondary | ICD-10-CM | POA: Diagnosis not present

## 2015-12-29 ENCOUNTER — Emergency Department (HOSPITAL_COMMUNITY)
Admission: EM | Admit: 2015-12-29 | Discharge: 2015-12-29 | Disposition: A | Discharge status: Home / Self Care | Payer: Medicare Other | Attending: Emergency Medicine | Admitting: Emergency Medicine

## 2015-12-29 ENCOUNTER — Encounter (HOSPITAL_COMMUNITY): Payer: Self-pay

## 2015-12-29 ENCOUNTER — Emergency Department (HOSPITAL_COMMUNITY): Payer: Medicare Other

## 2015-12-29 DIAGNOSIS — E039 Hypothyroidism, unspecified: Secondary | ICD-10-CM | POA: Insufficient documentation

## 2015-12-29 DIAGNOSIS — R51 Headache: Secondary | ICD-10-CM | POA: Insufficient documentation

## 2015-12-29 DIAGNOSIS — Z8719 Personal history of other diseases of the digestive system: Secondary | ICD-10-CM | POA: Insufficient documentation

## 2015-12-29 DIAGNOSIS — Z79899 Other long term (current) drug therapy: Secondary | ICD-10-CM | POA: Diagnosis not present

## 2015-12-29 DIAGNOSIS — Z7982 Long term (current) use of aspirin: Secondary | ICD-10-CM | POA: Diagnosis not present

## 2015-12-29 DIAGNOSIS — M1990 Unspecified osteoarthritis, unspecified site: Secondary | ICD-10-CM | POA: Diagnosis not present

## 2015-12-29 DIAGNOSIS — R519 Headache, unspecified: Secondary | ICD-10-CM

## 2015-12-29 DIAGNOSIS — H538 Other visual disturbances: Secondary | ICD-10-CM | POA: Diagnosis not present

## 2015-12-29 DIAGNOSIS — Z880 Allergy status to penicillin: Secondary | ICD-10-CM | POA: Diagnosis not present

## 2015-12-29 LAB — I-STAT CHEM 8, ED
BUN: 18 mg/dL (ref 6–20)
Calcium, Ion: 1.24 mmol/L (ref 1.13–1.30)
Chloride: 106 mmol/L (ref 101–111)
Creatinine, Ser: 0.8 mg/dL (ref 0.44–1.00)
Glucose, Bld: 105 mg/dL — ABNORMAL HIGH (ref 65–99)
HEMATOCRIT: 43 % (ref 36.0–46.0)
HEMOGLOBIN: 14.6 g/dL (ref 12.0–15.0)
POTASSIUM: 3.8 mmol/L (ref 3.5–5.1)
SODIUM: 141 mmol/L (ref 135–145)
TCO2: 21 mmol/L (ref 0–100)

## 2015-12-29 LAB — COMPREHENSIVE METABOLIC PANEL
ALBUMIN: 4.2 g/dL (ref 3.5–5.0)
ALT: 18 U/L (ref 14–54)
AST: 21 U/L (ref 15–41)
Alkaline Phosphatase: 69 U/L (ref 38–126)
Anion gap: 8 (ref 5–15)
BILIRUBIN TOTAL: 0.6 mg/dL (ref 0.3–1.2)
BUN: 16 mg/dL (ref 6–20)
CHLORIDE: 108 mmol/L (ref 101–111)
CO2: 21 mmol/L — ABNORMAL LOW (ref 22–32)
CREATININE: 0.82 mg/dL (ref 0.44–1.00)
Calcium: 9.7 mg/dL (ref 8.9–10.3)
GFR calc Af Amer: >60 mL/min (ref >60)
Glucose, Bld: 112 mg/dL — ABNORMAL HIGH (ref 65–99)
Potassium: 3.8 mmol/L (ref 3.5–5.1)
SODIUM: 137 mmol/L (ref 135–145)
TOTAL PROTEIN: 6.7 g/dL (ref 6.5–8.1)

## 2015-12-29 LAB — CBC
HCT: 42 % (ref 36.0–46.0)
Hemoglobin: 14.1 g/dL (ref 12.0–15.0)
MCH: 28.5 pg (ref 26.0–34.0)
MCHC: 33.6 g/dL (ref 30.0–36.0)
MCV: 85 fL (ref 78.0–100.0)
PLATELETS: 161 10*3/uL (ref 150–400)
RBC: 4.94 MIL/uL (ref 3.87–5.11)
RDW: 13.4 % (ref 11.5–15.5)
WBC: 5.3 10*3/uL (ref 4.0–10.5)

## 2015-12-29 LAB — I-STAT TROPONIN, ED: TROPONIN I, POC: 0.01 ng/mL (ref 0.00–0.08)

## 2015-12-29 LAB — DIFFERENTIAL
BASOS ABS: 0 10*3/uL (ref 0.0–0.1)
Basophils Relative: 1 %
Eosinophils Absolute: 0.2 10*3/uL (ref 0.0–0.7)
Eosinophils Relative: 3 %
LYMPHS PCT: 35 %
Lymphs Abs: 1.9 10*3/uL (ref 0.7–4.0)
MONO ABS: 0.2 10*3/uL (ref 0.1–1.0)
Monocytes Relative: 4 %
NEUTROS ABS: 3.1 10*3/uL (ref 1.7–7.7)
Neutrophils Relative %: 57 %

## 2015-12-29 LAB — APTT: aPTT: 25 seconds (ref 24–37)

## 2015-12-29 LAB — PROTIME-INR
INR: 0.98 (ref 0.00–1.49)
PROTHROMBIN TIME: 13.2 s (ref 11.6–15.2)

## 2015-12-29 NOTE — ED Notes (Signed)
The pt thinks she had a [place] on wednesdayam past.   She was talking on the phone and she had blurred vision she could not understand what the person on the other end was saying.  This episode lasted approx 30 minutes.  All symptoms went away except she has a headache still.  No previous history.  Alert oriented skin warm and dry no distress

## 2015-12-29 NOTE — ED Notes (Signed)
Pt A&OX4, ambulatory at d/c with steady gait but wheeled out of ED via wheelchair

## 2015-12-29 NOTE — ED Provider Notes (Addendum)
CSN: [id number]     Arrival date & time 12/29/15  1925 History   First MD Initiated Contact with Patient 12/29/15 2043     Chief Complaint  Patient presents with  . Headache     (Consider location/radiation/quality/duration/timing/severity/associated sxs/prior Treatment) HPI Comments: Patient with a history of hypothyroid presents with a 5-day history of frontal headache that waxes and wanes in intensity but is always there. No neck pain, fever, nausea, vomiting. She reports that 5 days ago she got up per her usual schedule. About 1/2 hour after waking she experienced bilateral blurred vision  Patient is a 65 y.o. female presenting with headaches. The history is provided by the patient and a relative. No language interpreter was used.  Headache Pain location:  Frontal Quality:  Dull Radiates to:  Does not radiate Duration:  5 days Timing:  Constant Progression:  Waxing and waning Chronicity:  New   Past Medical History  Diagnosis Date  . Hypothyroidism   . Abnormal EKG     recent w/u and neg echo  . Osteoarthritis of right knee   . GERD (gastroesophageal reflux disease)    Past Surgical History  Procedure Laterality Date  . Abdominal hysterectomy  2006  . Total knee arthroplasty Right 2009  . Knee arthroplasty Right   . Rectocele repair  06/10/2011    Procedure: POSTERIOR REPAIR (RECTOCELE);  Surgeon: Elveria Royals;  Location: [facility];  Service: Gynecology;  Laterality: N/A;   Family History  Problem Relation Age of Onset  . Colon cancer Neg Hx    Social History  Substance Use Topics  . Smoking status: Never Smoker   . Smokeless tobacco: Never Used  . Alcohol Use: 1.2 oz/week    2 Glasses of wine per week   OB History    No data available     Review of Systems  Neurological: Positive for headaches.      Allergies  Penicillins cross reactors and Sulfa drugs cross reactors  Home Medications   Prior to Admission medications   Medication Sig Start Date  End Date Taking? Authorizing Provider  aspirin EC 81 MG tablet 81 mg daily. As of 05/21/11, pt has not had in a week, temporarily discontinued for surgery 03/25/11   Larey Dresser, MD  levothyroxine (SYNTHROID, LEVOTHROID) 100 MCG tablet Take 100 mcg by mouth daily.     Historical Provider, MD  Multiple Vitamin (MULTIVITAMIN PO) Take 1 tablet by mouth as needed.     Historical Provider, MD  zolpidem (AMBIEN) 10 MG tablet Take 10 mg by mouth at bedtime as needed. Reported on 09/07/2015    Historical Provider, MD   BP 135/79 mmHg  Pulse 64  Temp(Src) 97.5 F (36.4 C) (Oral)  Resp 16  Ht 5\' 6"  (1.676 m)  Wt 78.79 kg  BMI 28.05 kg/m2  SpO2 98% Physical Exam  Constitutional: She is oriented to person, place, and time. She appears well-developed and well-nourished. No distress.  HENT:  Head: Normocephalic.  Eyes: Conjunctivae are normal.  Neck: Neck supple. No tracheal deviation present.  Cardiovascular: Normal rate and regular rhythm.   Pulmonary/Chest: Effort normal. No respiratory distress.  Abdominal: Soft. She exhibits no distension.  Neurological: She is alert and oriented to person, place, and time. No cranial nerve deficit.  Skin: Skin is warm and dry.  Psychiatric: She has a normal mood and affect.    ED Course  Procedures (including critical care time) Labs Review Labs Reviewed  COMPREHENSIVE METABOLIC PANEL -  Abnormal; Notable for the following:    CO2 21 (*)    Glucose, Bld 112 (*)    All other components within normal limits  I-STAT CHEM 8, ED - Abnormal; Notable for the following:    Glucose, Bld 105 (*)    All other components within normal limits  PROTIME-INR  APTT  CBC  DIFFERENTIAL  I-STAT TROPOININ, ED  CBG MONITORING, ED   Results for orders placed or performed during the hospital encounter of 12/29/15  Protime-INR  Result Value Ref Range   Prothrombin Time 13.2 11.6 - 15.2 seconds   INR 0.98 0.00 - 1.49  APTT  Result Value Ref Range   aPTT 25 24 -  37 seconds  CBC  Result Value Ref Range   WBC 5.3 4.0 - 10.5 K/uL   RBC 4.94 3.87 - 5.11 MIL/uL   Hemoglobin 14.1 12.0 - 15.0 g/dL   HCT 42.0 36.0 - 46.0 %   MCV 85.0 78.0 - 100.0 fL   MCH 28.5 26.0 - 34.0 pg   MCHC 33.6 30.0 - 36.0 g/dL   RDW 13.4 11.5 - 15.5 %   Platelets 161 150 - 400 K/uL  Differential  Result Value Ref Range   Neutrophils Relative % 57 %   Neutro Abs 3.1 1.7 - 7.7 K/uL   Lymphocytes Relative 35 %   Lymphs Abs 1.9 0.7 - 4.0 K/uL   Monocytes Relative 4 %   Monocytes Absolute 0.2 0.1 - 1.0 K/uL   Eosinophils Relative 3 %   Eosinophils Absolute 0.2 0.0 - 0.7 K/uL   Basophils Relative 1 %   Basophils Absolute 0.0 0.0 - 0.1 K/uL  Comprehensive metabolic panel  Result Value Ref Range   Sodium 137 135 - 145 mmol/L   Potassium 3.8 3.5 - 5.1 mmol/L   Chloride 108 101 - 111 mmol/L   CO2 21 (L) 22 - 32 mmol/L   Glucose, Bld 112 (H) 65 - 99 mg/dL   BUN 16 6 - 20 mg/dL   Creatinine, Ser 0.82 0.44 - 1.00 mg/dL   Calcium 9.7 8.9 - 10.3 mg/dL   Total Protein 6.7 6.5 - 8.1 g/dL   Albumin 4.2 3.5 - 5.0 g/dL   AST 21 15 - 41 U/L   ALT 18 14 - 54 U/L   Alkaline Phosphatase 69 38 - 126 U/L   Total Bilirubin 0.6 0.3 - 1.2 mg/dL   GFR calc non Af Amer >60 >60 mL/min   GFR calc Af Amer >60 >60 mL/min   Anion gap 8 5 - 15  I-stat troponin, ED  Result Value Ref Range   Troponin i, poc 0.01 0.00 - 0.08 ng/mL   Comment 3          I-Stat Chem 8, ED  Result Value Ref Range   Sodium 141 135 - 145 mmol/L   Potassium 3.8 3.5 - 5.1 mmol/L   Chloride 106 101 - 111 mmol/L   BUN 18 6 - 20 mg/dL   Creatinine, Ser 0.80 0.44 - 1.00 mg/dL   Glucose, Bld 105 (H) 65 - 99 mg/dL   Calcium, Ion 1.24 1.13 - 1.30 mmol/L   TCO2 21 0 - 100 mmol/L   Hemoglobin 14.6 12.0 - 15.0 g/dL   HCT 43.0 36.0 - 46.0 %   Ct Head Wo Contrast  12/29/2015  CLINICAL DATA:  Persistent headache following TIA. Blurred vision. Initial encounter. EXAM: CT HEAD WITHOUT CONTRAST TECHNIQUE: Contiguous axial  images were obtained from the base  of the skull through the vertex without intravenous contrast. COMPARISON:  None. FINDINGS: There is no evidence of acute infarction, mass lesion, or intra- or extra-axial hemorrhage on CT. The posterior fossa, including the cerebellum, brainstem and fourth ventricle, is within normal limits. The third and lateral ventricles, and basal ganglia are unremarkable in appearance. The cerebral hemispheres are symmetric in appearance, with normal gray-white differentiation. No mass effect or midline shift is seen. There is no evidence of fracture; visualized osseous structures are unremarkable in appearance. The orbits are within normal limits. The paranasal sinuses and mastoid air cells are well-aerated. No significant soft tissue abnormalities are seen. IMPRESSION: Unremarkable noncontrast CT of the head. Electronically Signed   By: Garald Balding M.D.   On: 12/29/2015 22:34    Imaging Review No results found. I have personally reviewed and evaluated these images and lab results as part of my medical decision-making.   EKG Interpretation   Date/Time:  Sunday Dec 29 2015 19:41:44 EDT Ventricular Rate:  66 PR Interval:  142 QRS Duration: 86 QT Interval:  432 QTC Calculation: 452 R Axis:   -5 Text Interpretation:  Sinus rhythm with marked sinus arrhythmia Low  voltage QRS Nonspecific T wave abnormality Abnormal ECG No significant  change since last tracing Confirmed by Richie Bonanno MD, Toini Failla [id number]) on  12/29/2015 9:25:34 PM      MDM   Final diagnoses:  None    1. Headache  CT head, labs unremarkable for acute abnormality. The patient states she continues to have a low grade headache, declines treatment for pain. She is neurologically intact without focal abnormality. VSS.  Symptoms 5 days ago concerning for possible TIA although nothing revealed on head CT tonight. She has had no recurrence of symptoms and has a normal neurologic exam tonight. She can be  discharged home with PCP follow up for coordination of care. Dr. Laneta Simmers has seen and evaluated the patient.     Charlann Lange, PA-C 12/30/15 0036  Leo Grosser, MD 12/30/15 0200  Leo Grosser, MD 01/08/16 2049

## 2015-12-29 NOTE — ED Notes (Signed)
Pt took Advil 200 mg @ 5pm.

## 2015-12-29 NOTE — Discharge Instructions (Signed)
FOLLOW UP WITH DR. Dagmar Hait FOR FURTHER MANAGEMENT OF PREVIOUS SYMPTOMS OF SLURRED SPEECH/BLURRED VISION AND PERSISTENT HEADACHE. RETURN TO THE EMERGENCY DEPARTMENT IF SYMPTOMS OF SLURRED SPEECH/BLURRED VISION RETURN, HEADACHE BECOMES SEVERE, OR YOU HAVE NEW SYMPTOMS OF CONCERN.

## 2015-12-29 NOTE — ED Notes (Signed)
Pt reports morning of 12-25-15 talked to several people on the phone and was having hard time understanding the person, slurred speech.  Pt talked to two different people at that time.  Pt called person back 10 min after talking to them and pt didn't remember talking to that person.  Pt has had headache since then but today headache is worse.  No slurred speech, facial droop or arm drift.

## 2016-01-02 ENCOUNTER — Other Ambulatory Visit: Payer: Self-pay | Admitting: Internal Medicine

## 2016-01-02 DIAGNOSIS — G459 Transient cerebral ischemic attack, unspecified: Secondary | ICD-10-CM | POA: Diagnosis not present

## 2016-01-02 DIAGNOSIS — F329 Major depressive disorder, single episode, unspecified: Secondary | ICD-10-CM | POA: Diagnosis not present

## 2016-01-02 DIAGNOSIS — E784 Other hyperlipidemia: Secondary | ICD-10-CM | POA: Diagnosis not present

## 2016-01-02 DIAGNOSIS — Z1389 Encounter for screening for other disorder: Secondary | ICD-10-CM | POA: Diagnosis not present

## 2016-01-02 DIAGNOSIS — E038 Other specified hypothyroidism: Secondary | ICD-10-CM | POA: Diagnosis not present

## 2016-01-10 ENCOUNTER — Other Ambulatory Visit: Payer: Self-pay | Admitting: Internal Medicine

## 2016-01-10 ENCOUNTER — Ambulatory Visit
Admission: RE | Admit: 2016-01-10 | Discharge: 2016-01-10 | Disposition: A | Discharge status: Home / Self Care | Payer: Medicare Other | Source: Ambulatory Visit | Attending: Internal Medicine | Admitting: Internal Medicine

## 2016-01-10 DIAGNOSIS — R479 Unspecified speech disturbances: Secondary | ICD-10-CM | POA: Diagnosis not present

## 2016-01-10 DIAGNOSIS — S8012XA Contusion of left lower leg, initial encounter: Secondary | ICD-10-CM | POA: Diagnosis not present

## 2016-01-10 DIAGNOSIS — G459 Transient cerebral ischemic attack, unspecified: Secondary | ICD-10-CM

## 2016-01-10 DIAGNOSIS — Z6828 Body mass index (BMI) 28.0-28.9, adult: Secondary | ICD-10-CM | POA: Diagnosis not present

## 2016-01-10 MED ORDER — GADOBENATE DIMEGLUMINE 529 MG/ML IV SOLN
15.0000 mL | Freq: Once | INTRAVENOUS | Status: AC | PRN
  Administered 2016-01-10: 15 mL via INTRAVENOUS

## 2016-03-12 DIAGNOSIS — L821 Other seborrheic keratosis: Secondary | ICD-10-CM | POA: Diagnosis not present

## 2016-03-12 DIAGNOSIS — Z85828 Personal history of other malignant neoplasm of skin: Secondary | ICD-10-CM | POA: Diagnosis not present

## 2016-04-27 DIAGNOSIS — R300 Dysuria: Secondary | ICD-10-CM | POA: Diagnosis not present

## 2016-05-04 DIAGNOSIS — Z23 Encounter for immunization: Secondary | ICD-10-CM | POA: Diagnosis not present

## 2016-05-04 DIAGNOSIS — S0003XA Contusion of scalp, initial encounter: Secondary | ICD-10-CM | POA: Diagnosis not present

## 2016-05-04 DIAGNOSIS — Z6827 Body mass index (BMI) 27.0-27.9, adult: Secondary | ICD-10-CM | POA: Diagnosis not present

## 2016-05-04 DIAGNOSIS — G459 Transient cerebral ischemic attack, unspecified: Secondary | ICD-10-CM | POA: Diagnosis not present

## 2016-05-17 DIAGNOSIS — S82839A Other fracture of upper and lower end of unspecified fibula, initial encounter for closed fracture: Secondary | ICD-10-CM | POA: Diagnosis not present

## 2016-05-18 ENCOUNTER — Ambulatory Visit (INDEPENDENT_AMBULATORY_CARE_PROVIDER_SITE_OTHER): Payer: Medicare Other | Admitting: Orthopedic Surgery

## 2016-05-18 DIAGNOSIS — S8261XA Displaced fracture of lateral malleolus of right fibula, initial encounter for closed fracture: Secondary | ICD-10-CM

## 2016-05-25 DIAGNOSIS — Z6826 Body mass index (BMI) 26.0-26.9, adult: Secondary | ICD-10-CM | POA: Diagnosis not present

## 2016-05-25 DIAGNOSIS — M25571 Pain in right ankle and joints of right foot: Secondary | ICD-10-CM | POA: Diagnosis not present

## 2016-05-25 DIAGNOSIS — R05 Cough: Secondary | ICD-10-CM | POA: Diagnosis not present

## 2016-05-27 ENCOUNTER — Telehealth (INDEPENDENT_AMBULATORY_CARE_PROVIDER_SITE_OTHER): Payer: Self-pay | Admitting: *Deleted

## 2016-05-27 NOTE — Telephone Encounter (Signed)
Pt. Called and stated she had another xray at urgent care and states foot is broken and wanted to know if the boot she has is ok to use or if she needs something else. Pt. Requesting call back [phone]

## 2016-06-10 ENCOUNTER — Ambulatory Visit (INDEPENDENT_AMBULATORY_CARE_PROVIDER_SITE_OTHER): Payer: Medicare Other | Admitting: Orthopaedic Surgery

## 2016-06-12 DIAGNOSIS — Z124 Encounter for screening for malignant neoplasm of cervix: Secondary | ICD-10-CM | POA: Diagnosis not present

## 2016-06-24 ENCOUNTER — Ambulatory Visit (INDEPENDENT_AMBULATORY_CARE_PROVIDER_SITE_OTHER): Payer: Medicare Other | Admitting: Orthopaedic Surgery

## 2016-06-24 ENCOUNTER — Ambulatory Visit (INDEPENDENT_AMBULATORY_CARE_PROVIDER_SITE_OTHER): Payer: Medicare Other

## 2016-06-24 VITALS — BP 124/88 | HR 57 | Ht 66.0 in | Wt 164.0 lb

## 2016-06-24 DIAGNOSIS — M25571 Pain in right ankle and joints of right foot: Secondary | ICD-10-CM

## 2016-06-24 DIAGNOSIS — S93401A Sprain of unspecified ligament of right ankle, initial encounter: Secondary | ICD-10-CM

## 2016-06-24 NOTE — Progress Notes (Signed)
Office Visit Note   Patient: Renee Haynes           Date of Birth: [birth year]           MRN: [id number] Visit Date: 06/24/2016              Requested by: Prince Solian, MD [address] [place], [place] [address] PCP: Tivis Ringer, MD   Assessment & Plan: Visit Diagnoses:  1. Pain in right ankle and joints of right foot   2. Sprain of unspecified ligament of right ankle, initial encounter     Plan: Patient will use a Swede-O. We discussed ankle range of motion and strengthening exercises weightbearing as tolerated. We reviewed the x-rays which showed a chip lateral avulsion. She is weightbearing as tolerated and can work on peroneal strengthening which we discussed. She can return if she has persistent problems with her ankle  Follow-Up Instructions: Return if symptoms worsen or fail to improve.   Orders:  Orders Placed This Encounter  Procedures  . XR Ankle Complete Right   No orders of the defined types were placed in this encounter.     Procedures: No procedures performed   Clinical Data: No additional findings.   Subjective: Chief Complaint  Patient presents with  . Right Ankle - Follow-up    Patient returns for follow up avulsion anterior talofibular ligament distal right fibula.  The initial date of injury was 05/16/2016.  She injured ankle crossing the street, rolling her ankle. She say Dr. Sharol Given on 05/18/2016 and was placed in fracture boot. Patient went to Urgent Care in [place], [place] prior to visit with Dr. Sharol Given and was diagnosed with hairline fracture. She is weightbearing. She states that it really only hurts first thing in the morning. She has been working.    Patient has been ambulatory in a cam boot and has been working. She has a mostly sitdown job.  Review of Systems  Constitutional: Negative for chills and diaphoresis.  HENT: Negative for ear discharge, ear pain and nosebleeds.   Eyes: Negative for discharge and visual disturbance.    Respiratory: Negative for cough, choking and shortness of breath.   Cardiovascular: Negative for chest pain and palpitations.  Gastrointestinal: Negative for abdominal distention and abdominal pain.  Endocrine: Negative for cold intolerance and heat intolerance.  Genitourinary: Negative for flank pain and hematuria.  Skin: Negative for rash and wound.  Neurological: Negative for seizures and speech difficulty.  Hematological: Negative for adenopathy. Does not bruise/bleed easily.  Psychiatric/Behavioral: Negative for agitation and suicidal ideas.     Objective: Vital Signs: BP 124/88   Pulse (!) 57   Ht 5\' 6"  (1.676 m)   Wt 164 lb (74.4 kg)   BMI 26.47 kg/m   Physical Exam  Constitutional: She is oriented to person, place, and time. She appears well-developed.  HENT:  Head: Normocephalic.  Right Ear: External ear normal.  Left Ear: External ear normal.  Eyes: Pupils are equal, round, and reactive to light.  Neck: No tracheal deviation present. No thyromegaly present.  Cardiovascular: Normal rate.   Pulmonary/Chest: Effort normal.  Abdominal: Soft.  Neurological: She is alert and oriented to person, place, and time.  Skin: Skin is warm and dry.  Psychiatric: She has a normal mood and affect. Her behavior is normal.    Ortho Exam patient has negative anterior drawer exam of her ankle hip range of motion is normal and the reaches full extension pulses are 2+ no pitting edema. Midfoot  motion is normal sensation foot is intact. No negative popped of compression test negative straight leg raising.  Specialty Comments:  No specialty comments available.  Imaging: No results found.   PMFS History: Patient Active Problem List   Diagnosis Date Noted  . Sprain of unspecified ligament of right ankle, initial encounter 06/29/2016  . Rectocele 06/10/2011  . Abnormal ECG 03/26/2011  . Dyspnea on exertion 03/26/2011   Past Medical History:  Diagnosis Date  . Abnormal EKG     recent w/u and neg echo  . GERD (gastroesophageal reflux disease)   . Hypothyroidism   . Osteoarthritis of right knee     Family History  Problem Relation Age of Onset  . Colon cancer Neg Hx     Past Surgical History:  Procedure Laterality Date  . ABDOMINAL HYSTERECTOMY  2006  . KNEE ARTHROPLASTY Right   . RECTOCELE REPAIR  06/10/2011   Procedure: POSTERIOR REPAIR (RECTOCELE);  Surgeon: Elveria Royals;  Location: [facility];  Service: Gynecology;  Laterality: N/A;  . TOTAL KNEE ARTHROPLASTY Right 2009   Social History   Occupational History  . Not on file.   Social History Main Topics  . Smoking status: Never Smoker  . Smokeless tobacco: Never Used  . Alcohol use 1.2 oz/week    2 Glasses of wine per week  . Drug use: No  . Sexual activity: Not on file

## 2016-06-29 ENCOUNTER — Encounter (INDEPENDENT_AMBULATORY_CARE_PROVIDER_SITE_OTHER): Payer: Self-pay | Admitting: Orthopaedic Surgery

## 2016-06-29 DIAGNOSIS — S93401A Sprain of unspecified ligament of right ankle, initial encounter: Secondary | ICD-10-CM | POA: Insufficient documentation

## 2016-07-08 ENCOUNTER — Telehealth (INDEPENDENT_AMBULATORY_CARE_PROVIDER_SITE_OTHER): Payer: Self-pay | Admitting: Orthopaedic Surgery

## 2016-07-08 NOTE — Telephone Encounter (Signed)
I called patient and advised. 

## 2016-07-08 NOTE — Telephone Encounter (Signed)
Pt saw Dr. Lorin Mercy 2 wks ago for fx ankle, Lorin Mercy put her in and ankle brace and wanted to know how long she should wear it and when she can start walking and doing yoga again. She is headed to work but can leave detailed message on her cell phone. CB# [phone].

## 2016-07-08 NOTE — Telephone Encounter (Signed)
Please advise 

## 2016-07-08 NOTE — Telephone Encounter (Signed)
Ok to gradually resume normal work out activities as symptoms resolve and stop ankle brace. Should not need it after 3 to 6 wks   ucall thanks

## 2016-07-29 DIAGNOSIS — E038 Other specified hypothyroidism: Secondary | ICD-10-CM | POA: Diagnosis not present

## 2016-07-29 DIAGNOSIS — E784 Other hyperlipidemia: Secondary | ICD-10-CM | POA: Diagnosis not present

## 2016-07-29 DIAGNOSIS — Z0000 Encounter for general adult medical examination without abnormal findings: Secondary | ICD-10-CM | POA: Diagnosis not present

## 2016-08-05 DIAGNOSIS — M1990 Unspecified osteoarthritis, unspecified site: Secondary | ICD-10-CM | POA: Diagnosis not present

## 2016-08-05 DIAGNOSIS — F329 Major depressive disorder, single episode, unspecified: Secondary | ICD-10-CM | POA: Diagnosis not present

## 2016-08-05 DIAGNOSIS — M859 Disorder of bone density and structure, unspecified: Secondary | ICD-10-CM | POA: Diagnosis not present

## 2016-08-05 DIAGNOSIS — K219 Gastro-esophageal reflux disease without esophagitis: Secondary | ICD-10-CM | POA: Diagnosis not present

## 2016-08-05 DIAGNOSIS — M25571 Pain in right ankle and joints of right foot: Secondary | ICD-10-CM | POA: Diagnosis not present

## 2016-08-05 DIAGNOSIS — Z6828 Body mass index (BMI) 28.0-28.9, adult: Secondary | ICD-10-CM | POA: Diagnosis not present

## 2016-08-05 DIAGNOSIS — Z1389 Encounter for screening for other disorder: Secondary | ICD-10-CM | POA: Diagnosis not present

## 2016-08-05 DIAGNOSIS — G459 Transient cerebral ischemic attack, unspecified: Secondary | ICD-10-CM | POA: Diagnosis not present

## 2016-08-05 DIAGNOSIS — G4700 Insomnia, unspecified: Secondary | ICD-10-CM | POA: Diagnosis not present

## 2016-08-05 DIAGNOSIS — E038 Other specified hypothyroidism: Secondary | ICD-10-CM | POA: Diagnosis not present

## 2016-08-05 DIAGNOSIS — E784 Other hyperlipidemia: Secondary | ICD-10-CM | POA: Diagnosis not present

## 2016-08-05 DIAGNOSIS — Z0000 Encounter for general adult medical examination without abnormal findings: Secondary | ICD-10-CM | POA: Diagnosis not present

## 2016-08-06 DIAGNOSIS — Z23 Encounter for immunization: Secondary | ICD-10-CM | POA: Diagnosis not present

## 2016-09-08 DIAGNOSIS — Z1212 Encounter for screening for malignant neoplasm of rectum: Secondary | ICD-10-CM | POA: Diagnosis not present

## 2016-09-22 IMAGING — CR DG HAND COMPLETE 3+V*R*
3 series · 3 of 3 positions shown · non-contrast
Comparison: None.

CLINICAL DATA: RIGHT hand injury. Right hand injury/pain. 2 days
ago tripped in closet. Att: 4-th finger.

EXAM:
RIGHT HAND - COMPLETE 3+ VIEW

[PA]
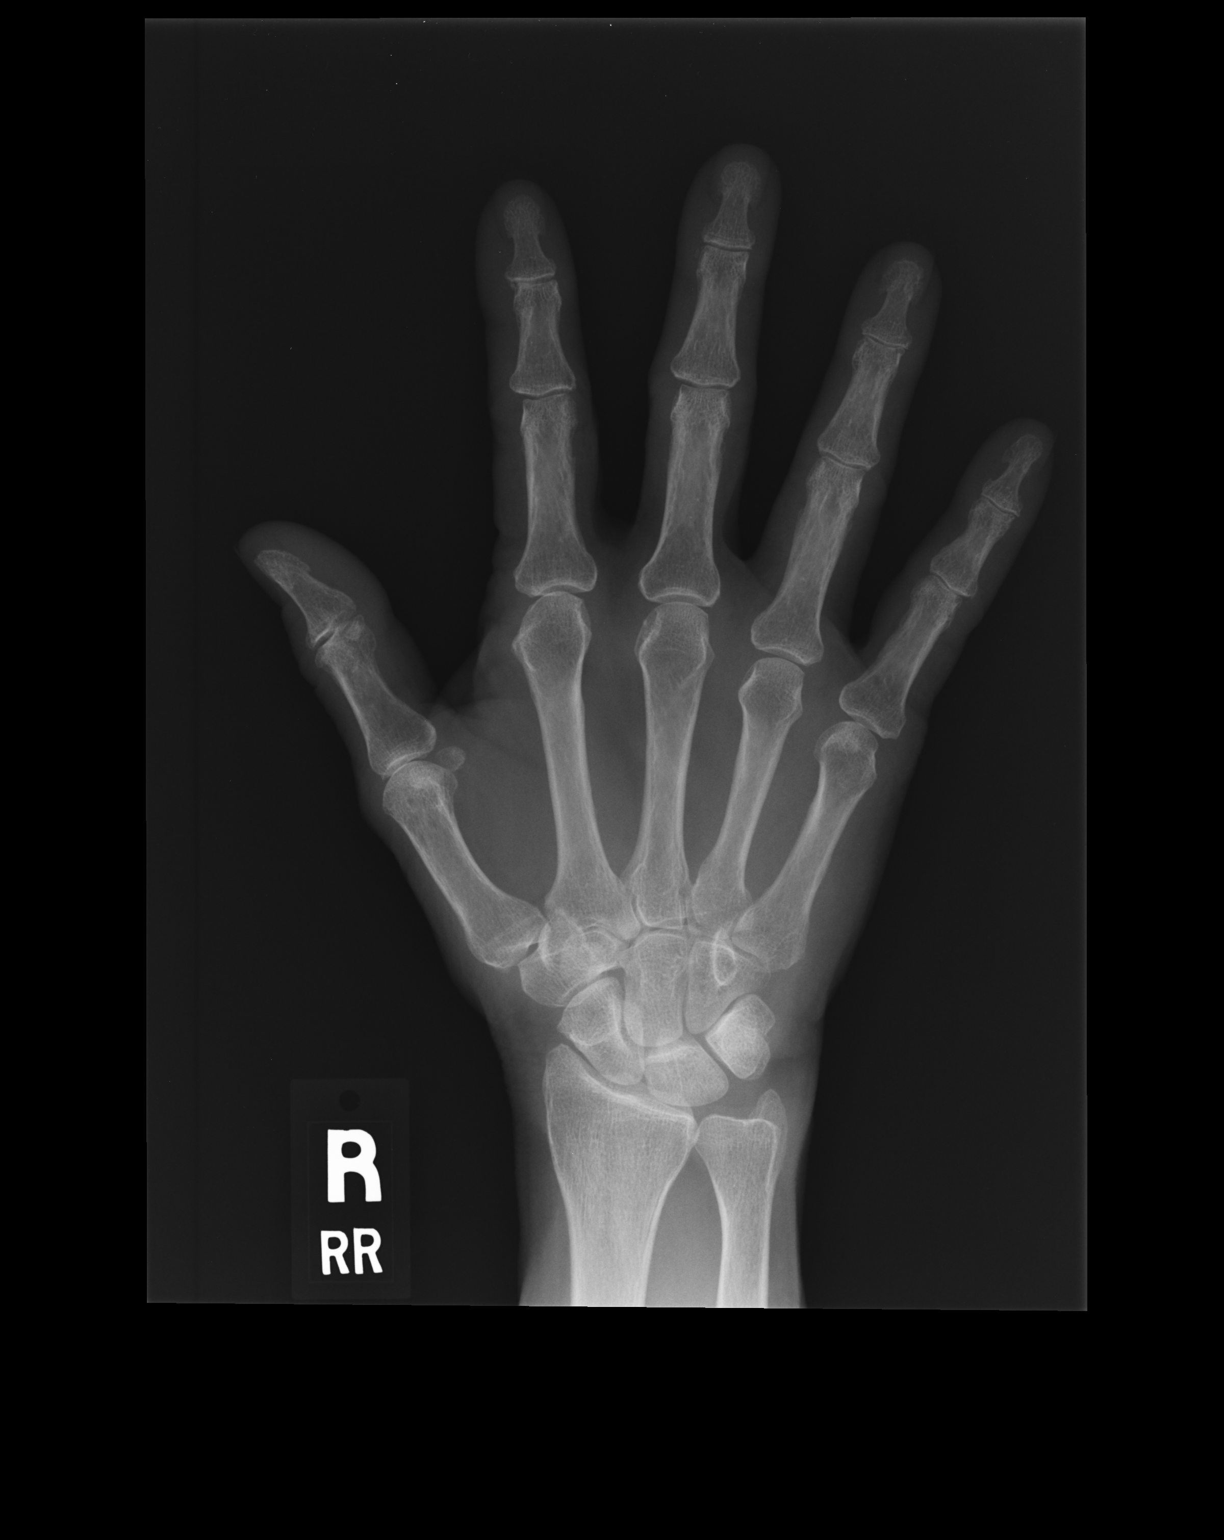

[lateral]
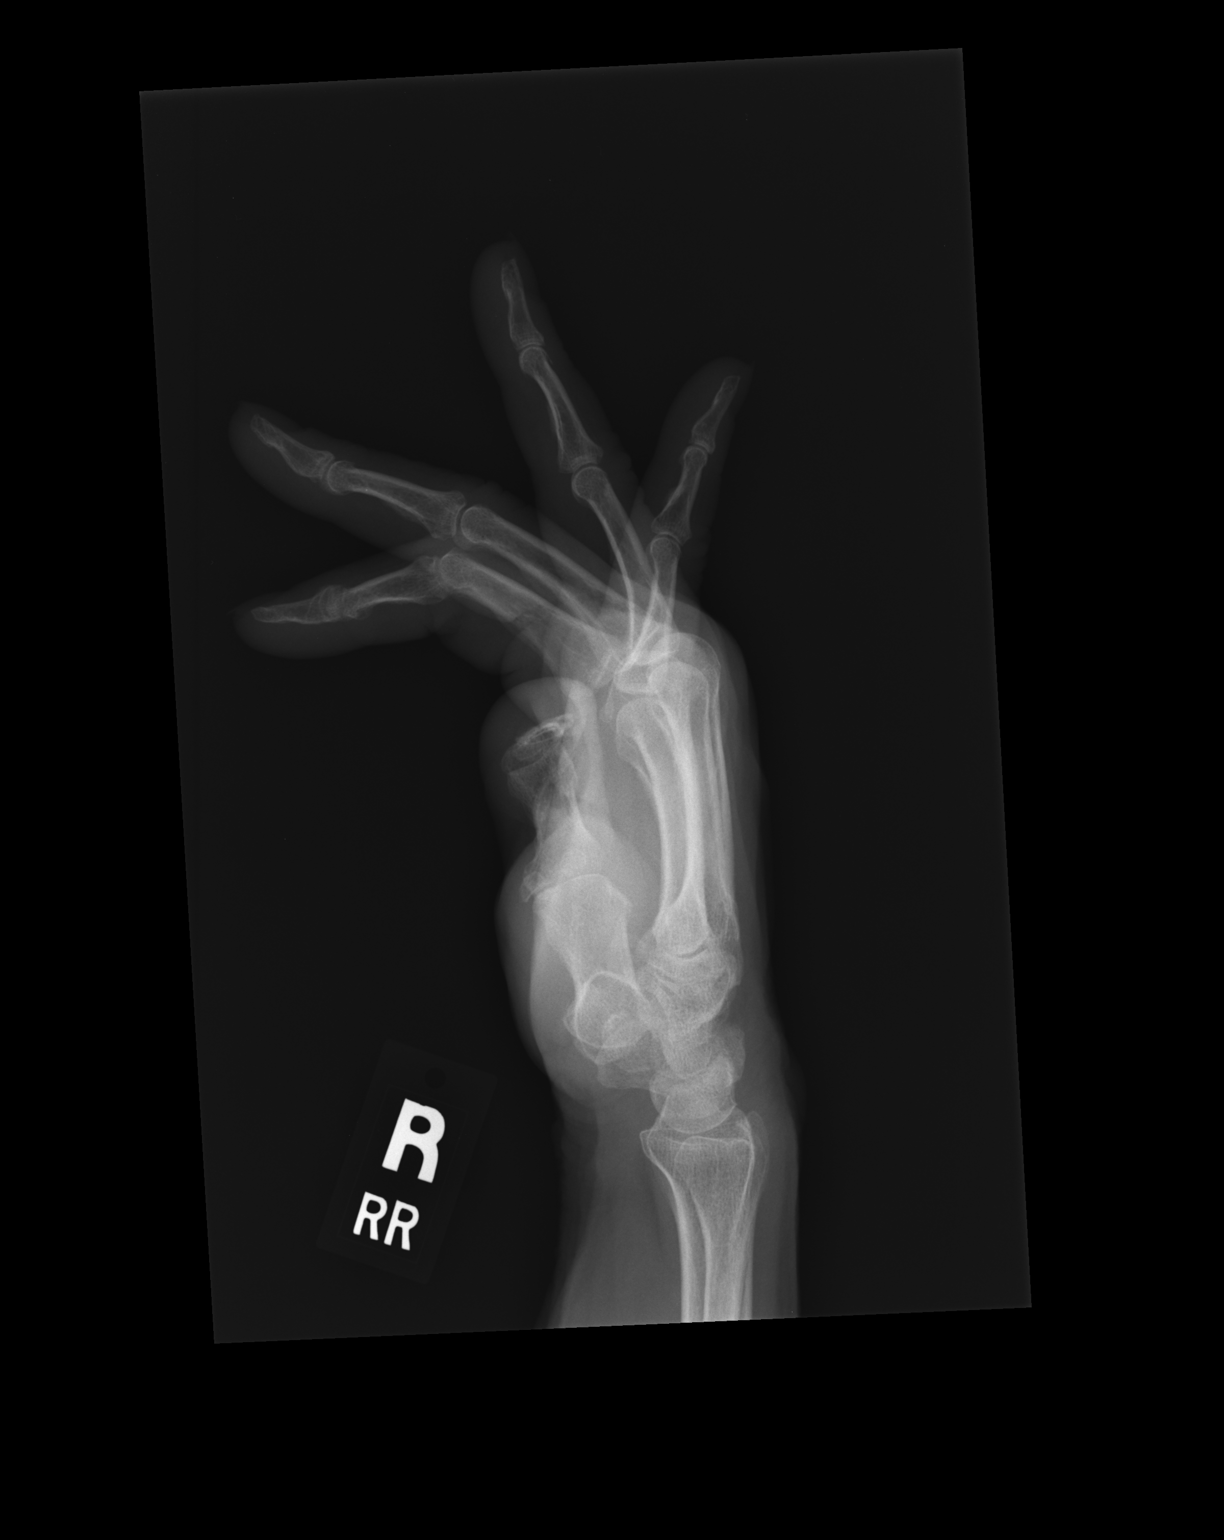

[pa obl]
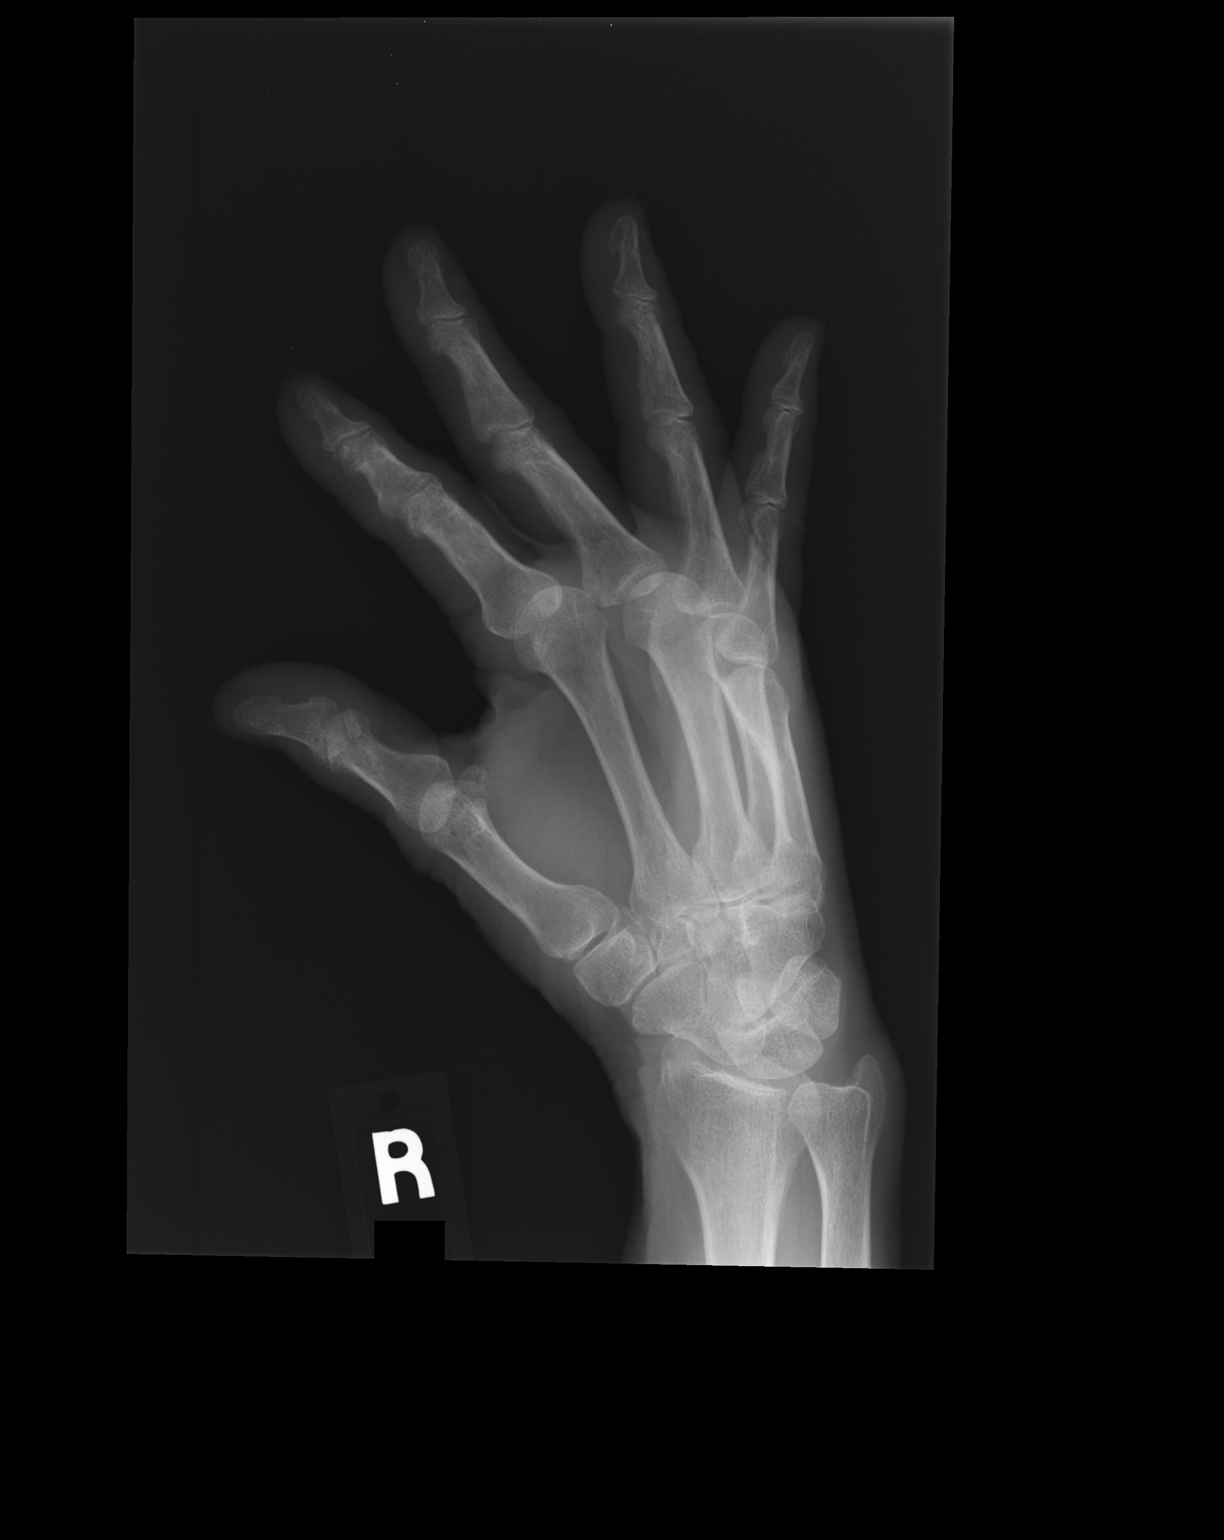

[3 of 3 positions shown; findings below may reference images not displayed]

FINDINGS: No evidence of fracture of the carpal or metacarpal bones.
Radiocarpal joint is intact. Phalanges are normal. No soft tissue
injury.
IMPRESSION: No fracture or dislocation.

## 2016-09-23 DIAGNOSIS — Z85828 Personal history of other malignant neoplasm of skin: Secondary | ICD-10-CM | POA: Diagnosis not present

## 2016-09-23 DIAGNOSIS — L821 Other seborrheic keratosis: Secondary | ICD-10-CM | POA: Diagnosis not present

## 2016-09-23 DIAGNOSIS — L814 Other melanin hyperpigmentation: Secondary | ICD-10-CM | POA: Diagnosis not present

## 2016-09-23 DIAGNOSIS — L720 Epidermal cyst: Secondary | ICD-10-CM | POA: Diagnosis not present

## 2016-09-23 DIAGNOSIS — R208 Other disturbances of skin sensation: Secondary | ICD-10-CM | POA: Diagnosis not present

## 2016-09-23 DIAGNOSIS — D225 Melanocytic nevi of trunk: Secondary | ICD-10-CM | POA: Diagnosis not present

## 2016-09-23 DIAGNOSIS — L570 Actinic keratosis: Secondary | ICD-10-CM | POA: Diagnosis not present

## 2016-09-23 DIAGNOSIS — D1801 Hemangioma of skin and subcutaneous tissue: Secondary | ICD-10-CM | POA: Diagnosis not present

## 2016-10-15 DIAGNOSIS — Z6830 Body mass index (BMI) 30.0-30.9, adult: Secondary | ICD-10-CM | POA: Diagnosis not present

## 2016-10-15 DIAGNOSIS — H6692 Otitis media, unspecified, left ear: Secondary | ICD-10-CM | POA: Diagnosis not present

## 2016-10-15 DIAGNOSIS — J310 Chronic rhinitis: Secondary | ICD-10-CM | POA: Diagnosis not present

## 2016-10-22 ENCOUNTER — Other Ambulatory Visit: Payer: Self-pay | Admitting: Internal Medicine

## 2016-10-22 DIAGNOSIS — Z1231 Encounter for screening mammogram for malignant neoplasm of breast: Secondary | ICD-10-CM

## 2016-12-03 ENCOUNTER — Ambulatory Visit
Admission: RE | Admit: 2016-12-03 | Discharge: 2016-12-03 | Disposition: A | Discharge status: Home / Self Care | Payer: Medicare Other | Source: Ambulatory Visit | Attending: Internal Medicine | Admitting: Internal Medicine

## 2016-12-03 ENCOUNTER — Other Ambulatory Visit: Payer: Self-pay | Admitting: Internal Medicine

## 2016-12-03 DIAGNOSIS — Z1231 Encounter for screening mammogram for malignant neoplasm of breast: Secondary | ICD-10-CM | POA: Diagnosis not present

## 2017-01-25 IMAGING — MR MR MRA HEAD W/O CM
10 of 14 series · 28 of 48 positions shown · IV contrast (Multihance 15ml)
Comparison: CT head without contrast 12/29/2015

CLINICAL DATA: Episode of difficulty with speech 2 weeks ago.
Symptoms have since resolved.

EXAM:
MRI HEAD WITHOUT CONTRAST
MRA HEAD WITHOUT CONTRAST
MRA NECK WITHOUT AND WITH CONTRAST
TECHNIQUE: Multiplanar, multiecho pulse sequences of the brain and surrounding
structures were obtained without with intravenous contrast.
Angiographic images of the Circle of Willis were obtained using MRA
technique without intravenous contrast. Angiographic images of the
neck were obtained using MRA technique without and with intravenous
contrast. Carotid stenosis measurements (when applicable) are
obtained utilizing NASCET criteria, using the distal internal
carotid diameter as the denominator.
CONTRAST:  15mL MULTIHANCE GADOBENATE DIMEGLUMINE 529 MG/ML IV SOLN

[Series 5: T1 · sagittal · 4.0mm · 0.75mm/px · 3 of 31 slices shown (1 of 2)]
[im 1/31]
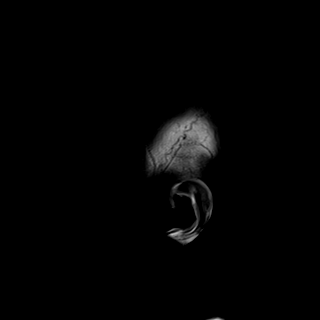
[im 16/31]
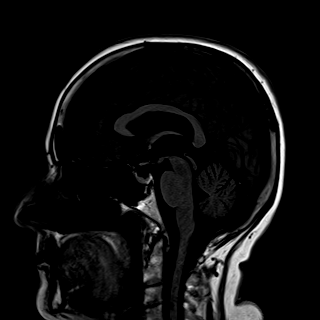
[im 31/31]
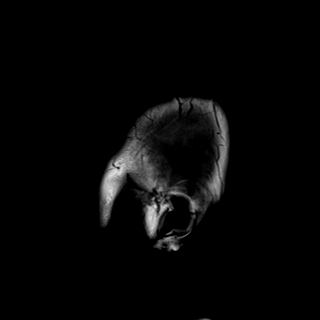

[Series 6: DWI · axial · 3.0mm · 1.44mm/px · z∈[-21,+118]mm · 4 of 86 slices shown (1 of 4)]
[im 1/86]
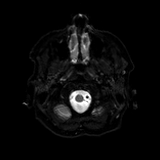
[im 29/86]
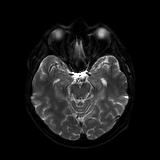
[im 57/86]
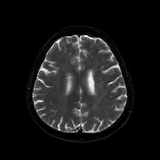
[im 86/86]
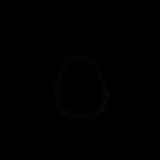

[Series 7: DWI · axial · 3.0mm · 1.44mm/px · z∈[-21,+118]mm · 2 of 42 slices shown (2 of 4)]
[im 1/42]
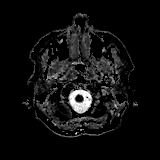
[im 42/42]
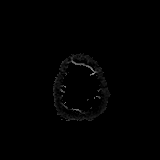

[Series 8: DWI · coronal · 5.0mm · 1.44mm/px · 3 of 56 slices shown (3 of 4)]
[im 1/56]
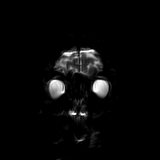
[im 28/56]
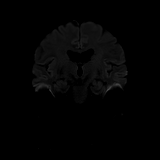
[im 56/56]
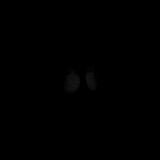

[Series 9: DWI · coronal · 5.0mm · 1.44mm/px · 1 of 28 slices shown (4 of 4)]
[im 1/28]
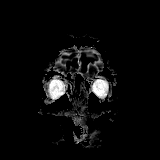

[Series 13: T2 · axial · 4.0mm · 0.36mm/px · 1 of 28 slices shown (1 of 2)]
[im 1/28]
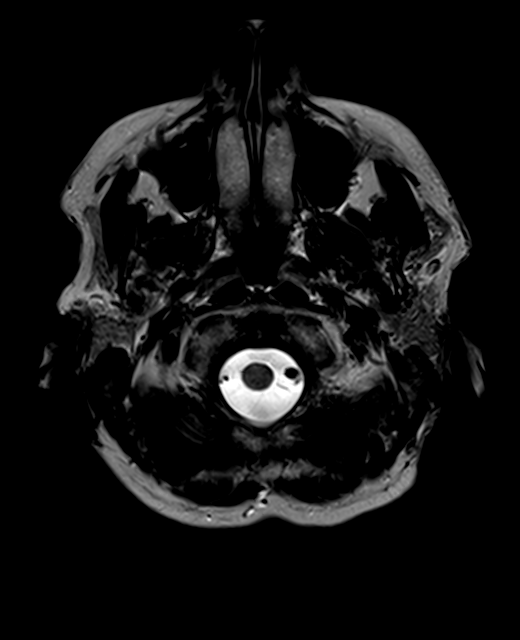

[Series 14: tof_[id number]_multi-slab · axial · 0.6mm · 0.26mm/px · z∈[-24,+34]mm · 5 of 149 slices shown]
[im 1/149]
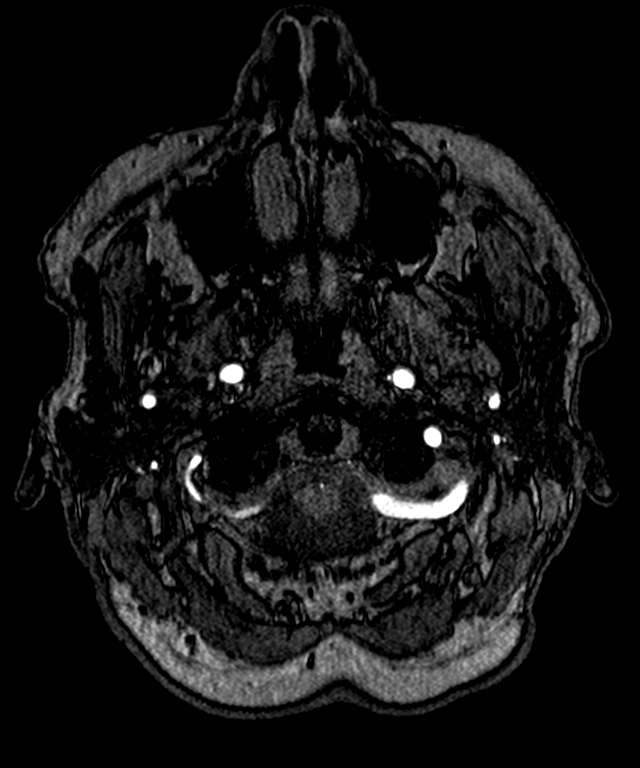
[im 25/149]
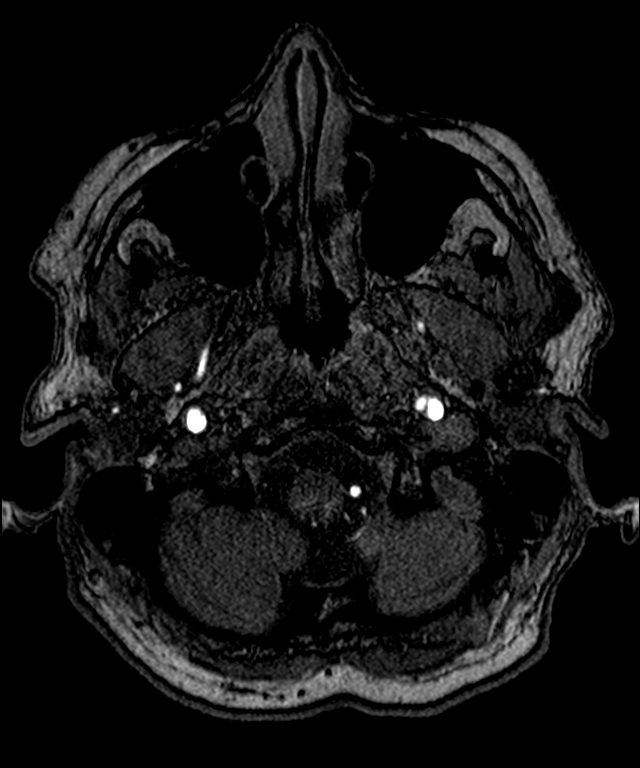
[im 50/149]
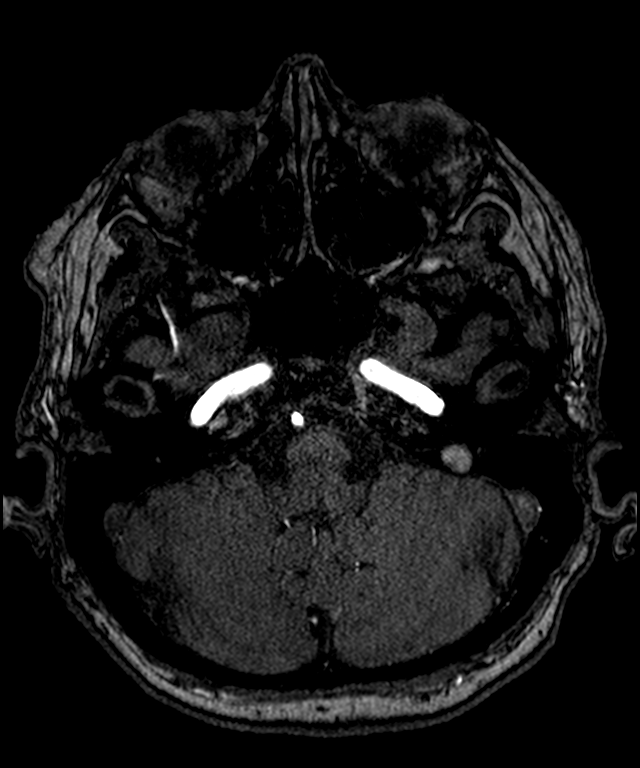
[im 75/149]
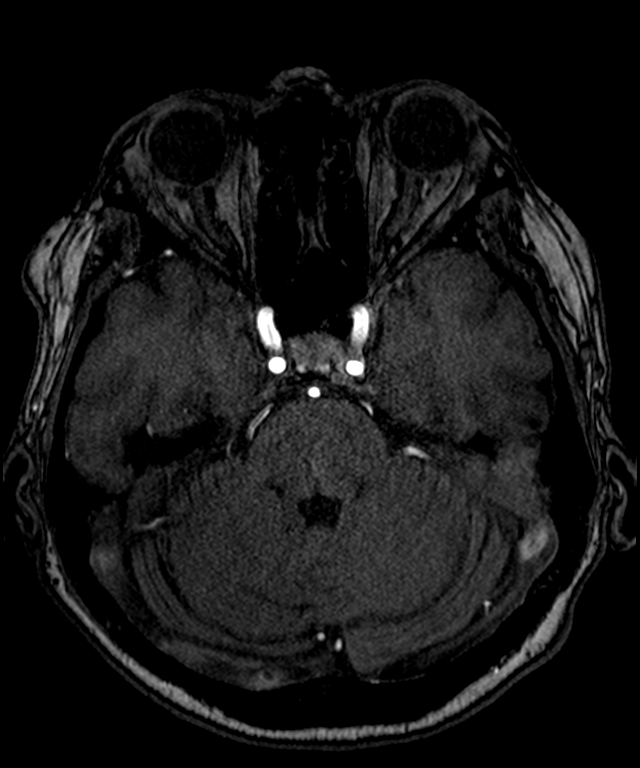
[im 99/149]
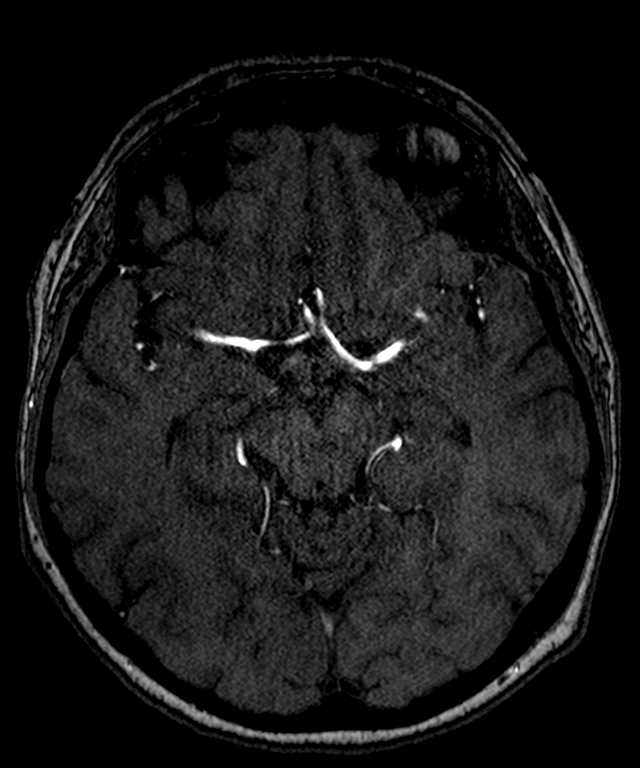

[Series 18: FLAIR · axial · 4.0mm · 0.72mm/px · 1 of 28 slices shown]
[im 1/28]
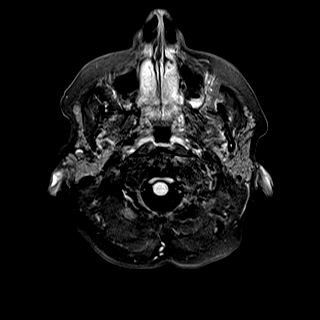

[Series 22: T1 · axial · 1.0mm · 0.90mm/px · z∈[-27,+116]mm · 7 of 144 slices shown (2 of 2)]
[im 1/144]
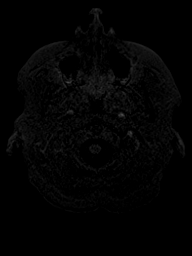
[im 24/144]
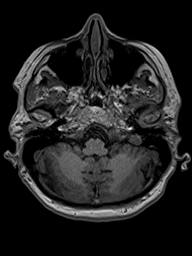
[im 48/144]
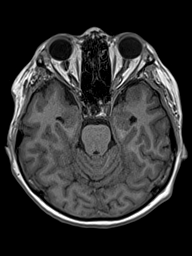
[im 72/144]
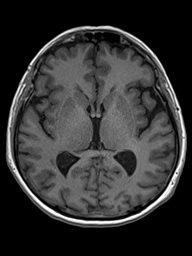
[im 96/144]
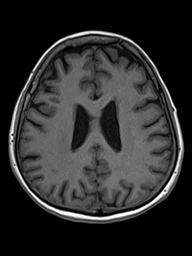
[im 120/144]
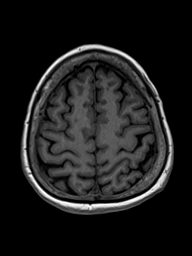
[im 144/144]
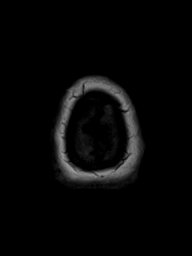

[Series 23: T2 · coronal · 4.5mm · 0.36mm/px · 1 of 28 slices shown (2 of 2)]
[im 1/28]
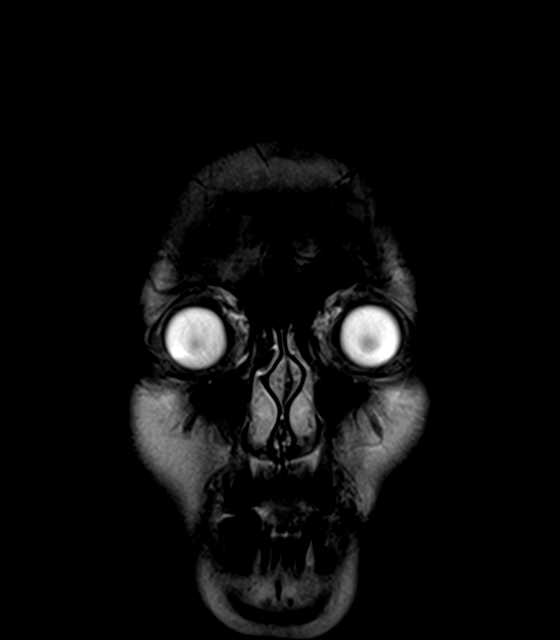

[28 of 48 positions shown; findings below may reference images not displayed]

FINDINGS: MRI HEAD FINDINGS

Mild atrophy and minimal white matter changes are within normal
limits for age. The diffusion-weighted images demonstrate no
evidence for acute or subacute infarction. No focal cortical
infarcts are evident. The internal auditory canals are within normal
limits bilaterally. The brainstem and cerebellum are normal. Flow is
present in the major intracranial arteries. The globes and orbits
are intact. The paranasal sinuses and mastoid air cells are clear.
The skullbase is within normal limits. Midline sagittal images are
unremarkable.

MRA HEAD FINDINGS

Internal carotid arteries are within normal limits from the high
cervical segments through the ICA termini. The A1 and M1 segments
are normal. The anterior communicating artery is patent. The MCA
bifurcations are intact. ACA and MCA branch vessels are within
normal limits.

The left vertebral artery is the dominant vessel. The left PICA
origin is visualized and normal. The right vertebral artery is
hypoplastic and terminates at the PICA. The basilar artery is within
normal limits. Both superior cerebellar arteries are visualized and
normal. The left posterior cerebral artery originates from the
basilar tip. The right posterior cerebral artery is of fetal type.
PCA branch vessels are within normal limits.

MRA NECK FINDINGS

Images demonstrate no significant flow disturbance Txabi there
carotid bifurcation. The left vertebral artery is dominant. Flow is
antegrade in the vertebral arteries bilaterally.

Postcontrast images demonstrate a 3 vessel arch configuration
without focal stenosis.

There is mild tortuosity of the right common carotid artery without
a significant stenosis. The right carotid bifurcation is intact. The
cervical right ICA is within normal limits.

The left common carotid artery is also mildly tortuous proximally.
The bifurcation is intact. There is a focal outpouching of the left
internal carotid artery measuring 3 x 5 mm just below the skullbase.
There is no associated stenosis of the lumen.

Both vertebral arteries originate from the subclavian arteries. The
left vertebral artery is dominant. There is signal loss at the
origin of the proximal right vertebral artery which may represent
significant stenosis. There is no significant stenosis of the left
vertebral artery or basilar artery. The postcontrast images
demonstrate a very small distal right vertebral artery extending to
the vertebrobasilar junction.
IMPRESSION: 1. Normal MRI appearance of the brain for age.
2. Normal variant MRA circle of Willis without significant proximal
stenosis, aneurysm, or branch vessel occlusion.
3. 5 mm medial outpouching of the distal cervical left internal
carotid artery likely represents a small pseudoaneurysm associated
with the previous dissection or traumatic injury. There is no
compromise of the lumen. This could be a source of emboli.
4. Hypoplastic right vertebral artery without definite stenosis.

## 2017-02-26 DIAGNOSIS — N764 Abscess of vulva: Secondary | ICD-10-CM | POA: Diagnosis not present

## 2017-02-26 DIAGNOSIS — L292 Pruritus vulvae: Secondary | ICD-10-CM | POA: Diagnosis not present

## 2017-04-07 DIAGNOSIS — N390 Urinary tract infection, site not specified: Secondary | ICD-10-CM | POA: Diagnosis not present

## 2017-04-07 DIAGNOSIS — R300 Dysuria: Secondary | ICD-10-CM | POA: Diagnosis not present

## 2017-04-19 DIAGNOSIS — H01004 Unspecified blepharitis left upper eyelid: Secondary | ICD-10-CM | POA: Diagnosis not present

## 2017-04-19 DIAGNOSIS — H01001 Unspecified blepharitis right upper eyelid: Secondary | ICD-10-CM | POA: Diagnosis not present

## 2017-04-19 DIAGNOSIS — H00021 Hordeolum internum right upper eyelid: Secondary | ICD-10-CM | POA: Diagnosis not present

## 2017-04-19 DIAGNOSIS — H01002 Unspecified blepharitis right lower eyelid: Secondary | ICD-10-CM | POA: Diagnosis not present

## 2017-05-07 ENCOUNTER — Encounter: Payer: Self-pay | Admitting: Gastroenterology

## 2017-05-15 DIAGNOSIS — Z23 Encounter for immunization: Secondary | ICD-10-CM | POA: Diagnosis not present

## 2017-06-17 DIAGNOSIS — H01004 Unspecified blepharitis left upper eyelid: Secondary | ICD-10-CM | POA: Diagnosis not present

## 2017-06-17 DIAGNOSIS — H01005 Unspecified blepharitis left lower eyelid: Secondary | ICD-10-CM | POA: Diagnosis not present

## 2017-06-17 DIAGNOSIS — H00015 Hordeolum externum left lower eyelid: Secondary | ICD-10-CM | POA: Diagnosis not present

## 2017-06-17 DIAGNOSIS — H01002 Unspecified blepharitis right lower eyelid: Secondary | ICD-10-CM | POA: Diagnosis not present

## 2017-06-17 DIAGNOSIS — H01001 Unspecified blepharitis right upper eyelid: Secondary | ICD-10-CM | POA: Diagnosis not present

## 2017-08-17 ENCOUNTER — Encounter: Payer: Self-pay | Admitting: Gastroenterology

## 2017-09-02 ENCOUNTER — Ambulatory Visit (AMBULATORY_SURGERY_CENTER): Payer: Self-pay | Admitting: *Deleted

## 2017-09-02 ENCOUNTER — Other Ambulatory Visit: Payer: Self-pay

## 2017-09-02 VITALS — Ht 65.5 in | Wt 177.2 lb

## 2017-09-02 DIAGNOSIS — Z86010 Personal history of colonic polyps: Secondary | ICD-10-CM

## 2017-09-02 MED ORDER — NA SULFATE-K SULFATE-MG SULF 17.5-3.13-1.6 GM/177ML PO SOLN
1.0000 [IU] | Freq: Once | ORAL | 0 refills | Status: AC
Start: 2017-09-02 — End: 2017-09-02

## 2017-09-02 NOTE — Progress Notes (Signed)
No egg or soy allergy known to patient  No issues with past sedation with any surgeries  or procedures, no intubation problems  No diet pills per patient No home 02 use per patient  No blood thinners per patient  Pt denies issues with constipation  No A fib or A flutter  EMMI video sent to pt's e mail  

## 2017-09-03 ENCOUNTER — Encounter: Payer: Self-pay | Admitting: Gastroenterology

## 2017-09-17 ENCOUNTER — Encounter: Payer: Self-pay | Admitting: Gastroenterology

## 2017-09-17 ENCOUNTER — Ambulatory Visit (AMBULATORY_SURGERY_CENTER): Payer: PPO | Admitting: Gastroenterology

## 2017-09-17 ENCOUNTER — Other Ambulatory Visit: Payer: Self-pay

## 2017-09-17 VITALS — BP 103/63 | HR 63 | Temp 98.6°F | Resp 13 | Ht 65.0 in | Wt 177.0 lb

## 2017-09-17 DIAGNOSIS — E039 Hypothyroidism, unspecified: Secondary | ICD-10-CM | POA: Diagnosis not present

## 2017-09-17 DIAGNOSIS — Z86010 Personal history of colonic polyps: Secondary | ICD-10-CM | POA: Diagnosis not present

## 2017-09-17 DIAGNOSIS — E669 Obesity, unspecified: Secondary | ICD-10-CM | POA: Diagnosis not present

## 2017-09-17 DIAGNOSIS — D122 Benign neoplasm of ascending colon: Secondary | ICD-10-CM

## 2017-09-17 DIAGNOSIS — D120 Benign neoplasm of cecum: Secondary | ICD-10-CM

## 2017-09-17 DIAGNOSIS — Z8673 Personal history of transient ischemic attack (TIA), and cerebral infarction without residual deficits: Secondary | ICD-10-CM | POA: Diagnosis not present

## 2017-09-17 DIAGNOSIS — G4700 Insomnia, unspecified: Secondary | ICD-10-CM | POA: Diagnosis not present

## 2017-09-17 MED ORDER — SODIUM CHLORIDE 0.9 % IV SOLN: 500.0000 mL | Freq: Once | INTRAVENOUS | Status: DC

## 2017-09-17 NOTE — Progress Notes (Signed)
Report to PACU, RN, vss, BBS= Clear.  

## 2017-09-17 NOTE — Patient Instructions (Signed)
YOU HAD AN ENDOSCOPIC PROCEDURE TODAY AT [facility]:   Refer to the procedure report that was given to you for any specific questions about what was found during the examination.  If the procedure report does not answer your questions, please call your gastroenterologist to clarify.  If you requested that your care partner not be given the details of your procedure findings, then the procedure report has been included in a sealed envelope for you to review at your convenience later.  YOU SHOULD EXPECT: Some feelings of bloating in the abdomen. Passage of more gas than usual.  Walking can help get rid of the air that was put into your GI tract during the procedure and reduce the bloating. If you had a lower endoscopy (such as a colonoscopy or flexible sigmoidoscopy) you may notice spotting of blood in your stool or on the toilet paper. If you underwent a bowel prep for your procedure, you may not have a normal bowel movement for a few days.  Please Note:  You might notice some irritation and congestion in your nose or some drainage.  This is from the oxygen used during your procedure.  There is no need for concern and it should clear up in a day or so.  SYMPTOMS TO REPORT IMMEDIATELY:   Following lower endoscopy (colonoscopy or flexible sigmoidoscopy):  Excessive amounts of blood in the stool  Significant tenderness or worsening of abdominal pains  Swelling of the abdomen that is new, acute  Fever of 100F or higher   For urgent or emergent issues, a gastroenterologist can be reached at any hour by calling [phone].   DIET:  We do recommend a small meal at first, but then you may proceed to your regular diet.  Drink plenty of fluids but you should avoid alcoholic beverages for 24 hours. Try to increase the fiber in your diet, and drink plenty of water.  ACTIVITY:  You should plan to take it easy for the rest of today and you should NOT DRIVE or use heavy machinery until  tomorrow (because of the sedation medicines used during the test).    FOLLOW UP: Our staff will call the number listed on your records the next business day following your procedure to check on you and address any questions or concerns that you may have regarding the information given to you following your procedure. If we do not reach you, we will leave a message.  However, if you are feeling well and you are not experiencing any problems, there is no need to return our call.  We will assume that you have returned to your regular daily activities without incident.  If any biopsies were taken you will be contacted by phone or by letter within the next 1-3 weeks.  Please call us at [phone] if you have not heard about the biopsies in 3 weeks.    SIGNATURES/CONFIDENTIALITY: You and/or your care partner have signed paperwork which will be entered into your electronic medical record.  These signatures attest to the fact that that the information above on your After Visit Summary has been reviewed and is understood.  Full responsibility of the confidentiality of this discharge information lies with you and/or your care-partner.  You will need another colonoscopy in 1 year due to your sub-optimal prep.   The doctor was unable to view all of the colon as well as she would like.

## 2017-09-17 NOTE — Op Note (Signed)
[place] Patient Name: Marabella Popiel Procedure Date: 09/17/2017 8:24 AM MRN: [id number] Endoscopist: Mauri Pole , MD Age: 67 Referring MD:  Date of Birth: 11-25-1950 Gender: Female Account #: [id number] Procedure:                Colonoscopy Indications:              Surveillance: Personal history of adenomatous                            polyps on last colonoscopy 3 years ago, High risk                            colon cancer surveillance: Personal history of                            adenoma (10 mm or greater in size), High risk colon                            cancer surveillance: Personal history of multiple                            (3 or more) adenomas Medicines:                Monitored Anesthesia Care Procedure:                Pre-Anesthesia Assessment:                           - Prior to the procedure, a History and Physical                            was performed, and patient medications and                            allergies were reviewed. The patient's tolerance of                            previous anesthesia was also reviewed. The risks                            and benefits of the procedure and the sedation                            options and risks were discussed with the patient.                            All questions were answered, and informed consent                            was obtained. Prior Anticoagulants: The patient has                            taken no previous anticoagulant or antiplatelet  agents. ASA Grade Assessment: II - A patient with                            mild systemic disease. After reviewing the risks                            and benefits, the patient was deemed in                            satisfactory condition to undergo the procedure.                           After obtaining informed consent, the colonoscope                            was passed under direct vision. Throughout  the                            procedure, the patient's blood pressure, pulse, and                            oxygen saturations were monitored continuously. The                            Colonoscope was introduced through the anus and                            advanced to the the cecum, identified by                            appendiceal orifice and ileocecal valve. The                            colonoscopy was performed without difficulty. The                            patient tolerated the procedure well. The quality                            of the bowel preparation was fair. The ileocecal                            valve, appendiceal orifice, and rectum were                            photographed. Scope In: 8:34:11 AM Scope Out: 9:01:00 AM Scope Withdrawal Time: 0 hours 20 minutes 40 seconds  Total Procedure Duration: 0 hours 26 minutes 49 seconds  Findings:                 The perianal and digital rectal examinations were                            normal.  Two flat polyps were found in the ascending colon                            and cecum. The polyps were 8 to 10 mm in size.                            These polyps were removed with a cold snare.                            Resection and retrieval were complete.                           Multiple small and large-mouthed diverticula were                            found in the sigmoid colon and descending colon.                            There was narrowing of the colon in association                            with the diverticular opening.                           Non-bleeding internal hemorrhoids were found during                            retroflexion. The hemorrhoids were small. Complications:            No immediate complications. Estimated Blood Loss:     Estimated blood loss was minimal. Impression:               - Preparation of the colon was fair.                           - Two 8 to  10 mm polyps in the ascending colon and                            in the cecum, removed with a cold snare. Resected                            and retrieved.                           - Moderate diverticulosis in the sigmoid colon and                            in the descending colon. There was narrowing of the                            colon in association with the diverticular opening.                           - Non-bleeding internal hemorrhoids. Recommendation:           -  Patient has a contact number available for                            emergencies. The signs and symptoms of potential                            delayed complications were discussed with the                            patient. Return to normal activities tomorrow.                            Written discharge instructions were provided to the                            patient.                           - Resume previous diet.                           - Continue present medications.                           - Await pathology results.                           - Repeat colonoscopy in 1 year because the bowel                            preparation was suboptimal. Mauri Pole, MD 09/17/2017 9:09:14 AM This report has been signed electronically.

## 2017-09-17 NOTE — Progress Notes (Signed)
Called to room to assist during endoscopic procedure.  Patient ID and intended procedure confirmed with present staff. Received instructions for my participation in the procedure from the performing physician.  

## 2017-09-20 ENCOUNTER — Telehealth: Payer: Self-pay | Admitting: *Deleted

## 2017-09-20 DIAGNOSIS — N390 Urinary tract infection, site not specified: Secondary | ICD-10-CM | POA: Diagnosis not present

## 2017-09-20 DIAGNOSIS — R300 Dysuria: Secondary | ICD-10-CM | POA: Diagnosis not present

## 2017-09-20 NOTE — Telephone Encounter (Signed)
  Follow up Call-  Call back number 09/17/2017  Post procedure Call Back phone  # [phone]  Permission to leave phone message Yes  Some recent data might be hidden     Patient questions:  Do you have a fever, pain , or abdominal swelling? No. Pain Score  0 *  Have you tolerated food without any problems? Yes.    Have you been able to return to your normal activities? Yes.    Do you have any questions about your discharge instructions: Diet   No. Medications  No. Follow up visit  No.  Do you have questions or concerns about your Care? No.  Actions: * If pain score is 4 or above: No action needed, pain <4.

## 2017-09-21 ENCOUNTER — Encounter: Payer: Self-pay | Admitting: Gastroenterology

## 2018-01-03 DIAGNOSIS — E038 Other specified hypothyroidism: Secondary | ICD-10-CM | POA: Diagnosis not present

## 2018-01-03 DIAGNOSIS — R82998 Other abnormal findings in urine: Secondary | ICD-10-CM | POA: Diagnosis not present

## 2018-01-03 DIAGNOSIS — M859 Disorder of bone density and structure, unspecified: Secondary | ICD-10-CM | POA: Diagnosis not present

## 2018-01-03 DIAGNOSIS — E7849 Other hyperlipidemia: Secondary | ICD-10-CM | POA: Diagnosis not present

## 2018-01-10 DIAGNOSIS — M859 Disorder of bone density and structure, unspecified: Secondary | ICD-10-CM | POA: Diagnosis not present

## 2018-01-10 DIAGNOSIS — K219 Gastro-esophageal reflux disease without esophagitis: Secondary | ICD-10-CM | POA: Diagnosis not present

## 2018-01-10 DIAGNOSIS — F329 Major depressive disorder, single episode, unspecified: Secondary | ICD-10-CM | POA: Diagnosis not present

## 2018-01-10 DIAGNOSIS — M1990 Unspecified osteoarthritis, unspecified site: Secondary | ICD-10-CM | POA: Diagnosis not present

## 2018-01-10 DIAGNOSIS — E038 Other specified hypothyroidism: Secondary | ICD-10-CM | POA: Diagnosis not present

## 2018-01-10 DIAGNOSIS — E7849 Other hyperlipidemia: Secondary | ICD-10-CM | POA: Diagnosis not present

## 2018-01-10 DIAGNOSIS — Z0000 Encounter for general adult medical examination without abnormal findings: Secondary | ICD-10-CM | POA: Diagnosis not present

## 2018-01-10 DIAGNOSIS — Z1389 Encounter for screening for other disorder: Secondary | ICD-10-CM | POA: Diagnosis not present

## 2018-01-10 DIAGNOSIS — G459 Transient cerebral ischemic attack, unspecified: Secondary | ICD-10-CM | POA: Diagnosis not present

## 2018-01-10 DIAGNOSIS — G4709 Other insomnia: Secondary | ICD-10-CM | POA: Diagnosis not present

## 2018-01-10 DIAGNOSIS — K635 Polyp of colon: Secondary | ICD-10-CM | POA: Diagnosis not present

## 2018-01-10 DIAGNOSIS — Z6829 Body mass index (BMI) 29.0-29.9, adult: Secondary | ICD-10-CM | POA: Diagnosis not present

## 2018-01-27 ENCOUNTER — Ambulatory Visit (INDEPENDENT_AMBULATORY_CARE_PROVIDER_SITE_OTHER): Payer: Medicare Other | Admitting: Orthopaedic Surgery

## 2018-02-08 ENCOUNTER — Ambulatory Visit (INDEPENDENT_AMBULATORY_CARE_PROVIDER_SITE_OTHER): Payer: Medicare Other | Admitting: Orthopaedic Surgery

## 2018-02-08 ENCOUNTER — Encounter (INDEPENDENT_AMBULATORY_CARE_PROVIDER_SITE_OTHER): Payer: Self-pay | Admitting: Orthopaedic Surgery

## 2018-02-08 ENCOUNTER — Ambulatory Visit (INDEPENDENT_AMBULATORY_CARE_PROVIDER_SITE_OTHER): Payer: PPO

## 2018-02-08 DIAGNOSIS — M25562 Pain in left knee: Secondary | ICD-10-CM

## 2018-02-08 NOTE — Progress Notes (Signed)
Office Visit Note   Patient: Renee Haynes           Date of Birth: 03/28/1951           MRN: [id number] Visit Date: 02/08/2018              Requested by: Prince Solian, MD [address] [place], [place] [address] PCP: Prince Solian, MD   Assessment & Plan: Visit Diagnoses:  1. Left knee pain, unspecified chronicity     Plan: We went over x-rays in detail and her clinical exam.  At this point she is tried and failed all forms of conservative treatment and is interested in knee replacement surgery.  We a long thorough discussion about this type of surgery.  She is had this before on her right hip.  She understands fully the risk and benefits of the surgery.  We talked about her intraoperative and postoperative course as well.  She does live at home alone.  She would need short rehab and skilled nursing following surgery and she understands this as well.  We will see her back in 2 weeks postoperative.  She would like to have this done in September.  All questions concerns were answered and addressed.  Follow-Up Instructions: Return for 2 weeks post-op.   Orders:  Orders Placed This Encounter  Procedures  . XR Knee 1-2 Views Left   No orders of the defined types were placed in this encounter.     Procedures: No procedures performed   Clinical Data: No additional findings.   Subjective: Chief Complaint  Patient presents with  . Left Knee - Pain  The patient is a very pleasant 67 year old active female who comes in for evaluation and treatment of worsening left knee pain for over a year now.  She says she has a lot of locking catching that knee and hurts along the medial joint line.  She is a lot of popping and crackling in the knee.  At times, it hurts on a daily basis.  It is detrimental effect directives daily living, quality of life, mobility.  She is a decade out from a right total knee arthroplasty that was done by 1 of my partners.  She says that I take care of  several friends of hers that she would like to see me for her left knee.  Offered to set her up for seeing my partner but she said that she would be fine with seeing me.  She has had injections in the past and she is gotten to the point where she says that does not help.  She is work on activity modification and weight loss.  She is work on Astron[employer].  HPI  Review of Systems She currently denies any headache, chest pain, shortness of breath, fever, chills, nausea, vomiting.  Objective: Vital Signs: There were no vitals taken for this visit.  Physical Exam She is alert and oriented x3 and in no acute distress Ortho Exam Examination of her left knee shows varus malalignment.  There is a lot of patellofemoral crepitation.  There is significant medial joint line tenderness and a positive Murray sign to the medial joint line.  She has pain with flexion and extension of the knee. Specialty Comments:  No specialty comments available.  Imaging: Xr Knee 1-2 Views Left  Result Date: 02/08/2018 2 views left knee show severe end-stage arthritis of the left knee.  There is varus malalignment.  There is complete loss of joint space  narrowing of the medial joint space.  There is large para-articular osteophytes throughout the knee as well as loose bodies.    PMFS History: Patient Active Problem List   Diagnosis Date Noted  . Sprain of unspecified ligament of right ankle, initial encounter 06/29/2016  . Rectocele 06/10/2011  . Abnormal ECG 03/26/2011  . Dyspnea on exertion 03/26/2011   Past Medical History:  Diagnosis Date  . Abnormal EKG    recent w/u and neg echo  . Cancer ([place])    BCC chest and Squamous cell on chest  . GERD (gastroesophageal reflux disease)   . Hypothyroidism    hypothyroidism  . Osteoarthritis of right knee     Family History  Problem Relation Age of Onset  . Breast cancer Sister   . Breast cancer Maternal Grandmother   . Colon polyps Mother   .  Colon cancer Neg Hx   . Esophageal cancer Neg Hx   . Rectal cancer Neg Hx   . Stomach cancer Neg Hx     Past Surgical History:  Procedure Laterality Date  . ABDOMINAL HYSTERECTOMY  2006  . COLONOSCOPY    . KNEE ARTHROPLASTY Right   . POLYPECTOMY    . RECTOCELE REPAIR  06/10/2011   Procedure: POSTERIOR REPAIR (RECTOCELE);  Surgeon: Elveria Royals;  Location: [facility];  Service: Gynecology;  Laterality: N/A;  . TOTAL KNEE ARTHROPLASTY Right 2009   Social History   Occupational History  . Not on file  Tobacco Use  . Smoking status: Never Smoker  . Smokeless tobacco: Never Used  Substance and Sexual Activity  . Alcohol use: Yes    Alcohol/week: 1.2 oz    Types: 2 Glasses of wine per week    Comment: not daily  . Drug use: No  . Sexual activity: Not on file

## 2018-03-07 DIAGNOSIS — M859 Disorder of bone density and structure, unspecified: Secondary | ICD-10-CM | POA: Diagnosis not present

## 2018-03-10 DIAGNOSIS — H01004 Unspecified blepharitis left upper eyelid: Secondary | ICD-10-CM | POA: Diagnosis not present

## 2018-03-10 DIAGNOSIS — H01001 Unspecified blepharitis right upper eyelid: Secondary | ICD-10-CM | POA: Diagnosis not present

## 2018-03-10 DIAGNOSIS — H01005 Unspecified blepharitis left lower eyelid: Secondary | ICD-10-CM | POA: Diagnosis not present

## 2018-03-10 DIAGNOSIS — H01002 Unspecified blepharitis right lower eyelid: Secondary | ICD-10-CM | POA: Diagnosis not present

## 2018-04-07 DIAGNOSIS — H01005 Unspecified blepharitis left lower eyelid: Secondary | ICD-10-CM | POA: Diagnosis not present

## 2018-04-07 DIAGNOSIS — H01001 Unspecified blepharitis right upper eyelid: Secondary | ICD-10-CM | POA: Diagnosis not present

## 2018-04-07 DIAGNOSIS — H01004 Unspecified blepharitis left upper eyelid: Secondary | ICD-10-CM | POA: Diagnosis not present

## 2018-04-07 DIAGNOSIS — H01002 Unspecified blepharitis right lower eyelid: Secondary | ICD-10-CM | POA: Diagnosis not present

## 2018-04-11 DIAGNOSIS — S60211A Contusion of right wrist, initial encounter: Secondary | ICD-10-CM | POA: Diagnosis not present

## 2018-04-11 DIAGNOSIS — S60031A Contusion of right middle finger without damage to nail, initial encounter: Secondary | ICD-10-CM | POA: Diagnosis not present

## 2018-04-16 DIAGNOSIS — Z23 Encounter for immunization: Secondary | ICD-10-CM | POA: Diagnosis not present

## 2018-05-05 ENCOUNTER — Telehealth: Payer: Self-pay | Admitting: Gastroenterology

## 2018-05-05 NOTE — Telephone Encounter (Signed)
Patient calling to see if she could get her recall changed from 02.2012 to 01.2020 due to having to get knee replacement surgery in February and wanting it done before friend Marisue Humble retires. Please advise.

## 2018-05-05 NOTE — Telephone Encounter (Signed)
Ok to switch it to Jan 2020

## 2018-05-05 NOTE — Telephone Encounter (Signed)
She wants to do her colonoscopy in January of 2020 instead of February 2020.

## 2018-05-05 NOTE — Telephone Encounter (Signed)
Notification date moved to December 2019. Recall date moved to 08/03/2018.

## 2018-05-06 ENCOUNTER — Other Ambulatory Visit: Payer: Self-pay | Admitting: Internal Medicine

## 2018-05-06 DIAGNOSIS — Z1231 Encounter for screening mammogram for malignant neoplasm of breast: Secondary | ICD-10-CM

## 2018-06-13 ENCOUNTER — Ambulatory Visit
Admission: RE | Admit: 2018-06-13 | Discharge: 2018-06-13 | Disposition: A | Discharge status: Home / Self Care | Payer: PPO | Source: Ambulatory Visit | Attending: Internal Medicine | Admitting: Internal Medicine

## 2018-06-13 DIAGNOSIS — Z1231 Encounter for screening mammogram for malignant neoplasm of breast: Secondary | ICD-10-CM

## 2018-06-24 ENCOUNTER — Telehealth (INDEPENDENT_AMBULATORY_CARE_PROVIDER_SITE_OTHER): Payer: Self-pay | Admitting: Orthopaedic Surgery

## 2018-06-24 NOTE — Telephone Encounter (Signed)
Patient called wanting a call back today in regards to her surgery. States she's left several messages and wants this scheduled asap.  Please call patient to advise [phone]

## 2018-08-02 ENCOUNTER — Telehealth (INDEPENDENT_AMBULATORY_CARE_PROVIDER_SITE_OTHER): Payer: Self-pay | Admitting: Orthopaedic Surgery

## 2018-08-02 NOTE — Telephone Encounter (Signed)
Patient stated she is scheduled for surgery on the 24th of January, would like to know if she needs to come in before surgery date.

## 2018-08-02 NOTE — Telephone Encounter (Signed)
See below, do you need to see her?

## 2018-08-02 NOTE — Telephone Encounter (Signed)
We do need to see her before surgery since it will be 6 months since we saw her.  So we can update her medical status and for insurance purposes.

## 2018-08-02 NOTE — Telephone Encounter (Signed)
Patient aware of the below message  

## 2018-08-03 HISTORY — PX: COLONOSCOPY: SHX174

## 2018-08-05 ENCOUNTER — Encounter: Payer: Self-pay | Admitting: Gastroenterology

## 2018-08-05 ENCOUNTER — Ambulatory Visit (AMBULATORY_SURGERY_CENTER): Payer: Self-pay

## 2018-08-05 VITALS — Ht 65.0 in | Wt 176.6 lb

## 2018-08-05 DIAGNOSIS — Z85828 Personal history of other malignant neoplasm of skin: Secondary | ICD-10-CM | POA: Diagnosis not present

## 2018-08-05 DIAGNOSIS — L570 Actinic keratosis: Secondary | ICD-10-CM | POA: Diagnosis not present

## 2018-08-05 DIAGNOSIS — L820 Inflamed seborrheic keratosis: Secondary | ICD-10-CM | POA: Diagnosis not present

## 2018-08-05 DIAGNOSIS — Z86010 Personal history of colonic polyps: Secondary | ICD-10-CM

## 2018-08-05 NOTE — Progress Notes (Signed)
No egg or soy allergy known to patient  No issues with past sedation with any surgeries  or procedures, no intubation problems  No diet pills per patient No home 02 use per patient  No blood thinners per patient  Pt denies issues with constipation  No A fib or A flutter  EMMI video sent to pt's e mail.pt declined  Sample of [place] given [id number] exp 11/21

## 2018-08-10 NOTE — Progress Notes (Signed)
Please place orders in Epic as patient is being scheduled for a pre-op appointment! Thank you! 

## 2018-08-15 ENCOUNTER — Encounter (INDEPENDENT_AMBULATORY_CARE_PROVIDER_SITE_OTHER): Payer: Self-pay | Admitting: Orthopaedic Surgery

## 2018-08-15 ENCOUNTER — Ambulatory Visit (INDEPENDENT_AMBULATORY_CARE_PROVIDER_SITE_OTHER): Payer: PPO | Admitting: Orthopaedic Surgery

## 2018-08-15 DIAGNOSIS — M1712 Unilateral primary osteoarthritis, left knee: Secondary | ICD-10-CM

## 2018-08-15 NOTE — Progress Notes (Signed)
Office Visit Note   Patient: Renee Haynes           Date of Birth: 11-10-1950           MRN: [id number] Visit Date: 08/15/2018              Requested by: Prince Solian, MD [address] [place], [place] [address] PCP: Prince Solian, MD   Assessment & Plan: Visit Diagnoses:  1. Unilateral primary osteoarthritis, left knee     Plan: We once again talked in detail about what surgery involves.  We discussed the risk benefits of surgery.  We talked about her intraoperative and postoperative course showing her knee model and her x-rays.  All questions and concerns were answered and addressed.  Surgery is scheduled for 08/26/2018.  Follow-Up Instructions: Return for 2 weeks post-op.   Orders:  No orders of the defined types were placed in this encounter.  No orders of the defined types were placed in this encounter.     Procedures: No procedures performed   Clinical Data: No additional findings.   Subjective: Chief Complaint  Patient presents with  . Left Knee - Follow-up  The patient is a 68 year old female with well known and well-documented severe osteoarthritis involving his left knee.  She is actually scheduled for a left knee replacement on 08/26/2018.  She has no change in medical status since we saw her last.  She has no active acute medical problems.  Her x-rays show severe end-stage arthritis of her left knee.  She actually had a right total knee arthroplasty done remotely.  Her pain is daily and at this point she is tried failed all forms of conservative treatment.  Her left knee osteoarthritis has definitely affected her activity living, her mobility, and her quality of life.  HPI  Review of Systems She currently denies any headache, chest pain, shortness of breath, fever, chills, nausea, vomiting.  Objective: Vital Signs: There were no vitals taken for this visit.  Physical Exam She is alert and orient x3 in no acute distress Ortho Exam Examination  of her left knee shows varus malalignment.  There is significant medial joint line tenderness and severe patellofemoral crepitation.  Her range of motion is full and the knee feels ligamentously stable. Specialty Comments:  No specialty comments available.  Imaging: No results found. X-rays reviewed and gone over with her today show end-stage arthritis of the left knee.  There is varus malalignment.  There is complete loss the medial joint space.  There are large particular osteophytes in all 3 compartments.  PMFS History: Patient Active Problem List   Diagnosis Date Noted  . Unilateral primary osteoarthritis, left knee 08/15/2018  . Sprain of unspecified ligament of right ankle, initial encounter 06/29/2016  . Rectocele 06/10/2011  . Abnormal ECG 03/26/2011  . Dyspnea on exertion 03/26/2011   Past Medical History:  Diagnosis Date  . Abnormal EKG    recent w/u and neg echo  . Cancer ([place])    BCC chest and Squamous cell on chest  . GERD (gastroesophageal reflux disease)   . Hypothyroidism    hypothyroidism  . Osteoarthritis of right knee     Family History  Problem Relation Age of Onset  . Breast cancer Sister   . Breast cancer Maternal Grandmother   . Colon polyps Mother   . Colon cancer Neg Hx   . Esophageal cancer Neg Hx   . Rectal cancer Neg Hx   . Stomach cancer Neg Hx  Past Surgical History:  Procedure Laterality Date  . ABDOMINAL HYSTERECTOMY  2006  . COLONOSCOPY    . KNEE ARTHROPLASTY Right   . POLYPECTOMY    . RECTOCELE REPAIR  06/10/2011   Procedure: POSTERIOR REPAIR (RECTOCELE);  Surgeon: Elveria Royals;  Location: [facility];  Service: Gynecology;  Laterality: N/A;  . TOTAL KNEE ARTHROPLASTY Right 2009  . UPPER GASTROINTESTINAL ENDOSCOPY     Social History   Occupational History  . Not on file  Tobacco Use  . Smoking status: Never Smoker  . Smokeless tobacco: Never Used  Substance and Sexual Activity  . Alcohol use: Yes    Alcohol/week: 2.0 standard  drinks    Types: 2 Glasses of wine per week    Comment: not daily  . Drug use: No  . Sexual activity: Not on file

## 2018-08-16 ENCOUNTER — Other Ambulatory Visit: Payer: Self-pay

## 2018-08-16 ENCOUNTER — Other Ambulatory Visit (INDEPENDENT_AMBULATORY_CARE_PROVIDER_SITE_OTHER): Payer: Self-pay

## 2018-08-16 ENCOUNTER — Other Ambulatory Visit (INDEPENDENT_AMBULATORY_CARE_PROVIDER_SITE_OTHER): Payer: Self-pay | Admitting: Physician Assistant

## 2018-08-16 ENCOUNTER — Encounter (HOSPITAL_COMMUNITY): Payer: Self-pay

## 2018-08-16 ENCOUNTER — Encounter (HOSPITAL_COMMUNITY)
Admission: RE | Admit: 2018-08-16 | Discharge: 2018-08-16 | Disposition: A | Discharge status: Home / Self Care | Payer: PPO | Source: Ambulatory Visit | Attending: Orthopaedic Surgery | Admitting: Orthopaedic Surgery

## 2018-08-16 DIAGNOSIS — Z01812 Encounter for preprocedural laboratory examination: Secondary | ICD-10-CM | POA: Diagnosis not present

## 2018-08-16 HISTORY — DX: Personal history of urinary calculi: Z87.442

## 2018-08-16 HISTORY — DX: Pneumonia, unspecified organism: J18.9

## 2018-08-16 HISTORY — DX: Cardiac arrhythmia, unspecified: I49.9

## 2018-08-16 LAB — CBC
HCT: 44 % (ref 36.0–46.0)
HEMOGLOBIN: 14.3 g/dL (ref 12.0–15.0)
MCH: 29.2 pg (ref 26.0–34.0)
MCHC: 32.5 g/dL (ref 30.0–36.0)
MCV: 89.8 fL (ref 80.0–100.0)
NRBC: 0 % (ref 0.0–0.2)
PLATELETS: 174 10*3/uL (ref 150–400)
RBC: 4.9 MIL/uL (ref 3.87–5.11)
RDW: 13.3 % (ref 11.5–15.5)
WBC: 4.7 10*3/uL (ref 4.0–10.5)

## 2018-08-16 LAB — BASIC METABOLIC PANEL
ANION GAP: 7 (ref 5–15)
BUN: 18 mg/dL (ref 8–23)
CALCIUM: 9.5 mg/dL (ref 8.9–10.3)
CHLORIDE: 109 mmol/L (ref 98–111)
CO2: 25 mmol/L (ref 22–32)
Creatinine, Ser: 0.67 mg/dL (ref 0.44–1.00)
GFR calc non Af Amer: >60 mL/min (ref >60)
Glucose, Bld: 92 mg/dL (ref 70–99)
POTASSIUM: 4.4 mmol/L (ref 3.5–5.1)
Sodium: 141 mmol/L (ref 135–145)

## 2018-08-16 LAB — SURGICAL PCR SCREEN
MRSA, PCR: NEGATIVE
Staphylococcus aureus: NEGATIVE

## 2018-08-16 NOTE — Patient Instructions (Signed)
Renee Haynes  08/16/2018   Your procedure is scheduled on: 08-26-18  Report to [facility] Main  Entrance  Report to admitting at     0830 AM    Call this number if you have problems the morning of surgery [phone]    Remember: Do not eat food or drink liquids :After Midnight.  BRUSH YOUR TEETH MORNING OF SURGERY AND RINSE YOUR MOUTH OUT, NO CHEWING GUM CANDY OR MINTS.     Take these medicines the morning of surgery with A SIP OF WATER: levothyroxine, ompeprazole                                You may not have any metal on your body including hair pins and              piercings  Do not wear jewelry, make-up, lotions, powders or perfumes, deodorant             Do not wear nail polish.  Do not shave  48 hours prior to surgery.             Do not bring valuables to the hospital. [place].  Contacts, dentures or bridgework may not be worn into surgery.  Leave suitcase in the car. After surgery it may be brought to your room.   Special Instructions: N/A              Please read over the following fact sheets you were given: _____________________________________________________________________           [facility] - Preparing for Surgery Before surgery, you can play an important role.  Because skin is not sterile, your skin needs to be as free of germs as possible.  You can reduce the number of germs on your skin by washing with CHG (chlorahexidine gluconate) soap before surgery.  CHG is an antiseptic cleaner which kills germs and bonds with the skin to continue killing germs even after washing. Please DO NOT use if you have an allergy to CHG or antibacterial soaps.  If your skin becomes reddened/irritated stop using the CHG and inform your nurse when you arrive at Short Stay. Do not shave (including legs and underarms) for at least 48 hours prior to the first CHG shower.  You may shave your  face/neck. Please follow these instructions carefully:  1.  Shower with CHG Soap the night before surgery and the  morning of Surgery.  2.  If you choose to wash your hair, wash your hair first as usual with your  normal  shampoo.  3.  After you shampoo, rinse your hair and body thoroughly to remove the  shampoo.                           4.  Use CHG as you would any other liquid soap.  You can apply chg directly  to the skin and wash                       Gently with a scrungie or clean washcloth.  5.  Apply the CHG Soap to your body ONLY FROM THE NECK DOWN.   Do  not use on face/ open                           Wound or open sores. Avoid contact with eyes, ears mouth and genitals (private parts).                       Wash face,  Genitals (private parts) with your normal soap.             6.  Wash thoroughly, paying special attention to the area where your surgery  will be performed.  7.  Thoroughly rinse your body with warm water from the neck down.  8.  DO NOT shower/wash with your normal soap after using and rinsing off  the CHG Soap.                9.  Pat yourself dry with a clean towel.            10.  Wear clean pajamas.            11.  Place clean sheets on your bed the night of your first shower and do not  sleep with pets. Day of Surgery : Do not apply any lotions/deodorants the morning of surgery.  Please wear clean clothes to the hospital/surgery center.  FAILURE TO FOLLOW THESE INSTRUCTIONS MAY RESULT IN THE CANCELLATION OF YOUR SURGERY PATIENT SIGNATURE_________________________________  NURSE SIGNATURE__________________________________  ________________________________________________________________________   [name]  An incentive spirometer is a tool that can help keep your lungs clear and active. This tool measures how well you are filling your lungs with each breath. Taking long deep breaths may help reverse or decrease the chance of developing breathing  (pulmonary) problems (especially infection) following:  A long period of time when you are unable to move or be active. BEFORE THE PROCEDURE   If the spirometer includes an indicator to show your best effort, your nurse or respiratory therapist will set it to a desired goal.  If possible, sit up straight or lean slightly forward. Try not to slouch.  Hold the incentive spirometer in an upright position. INSTRUCTIONS FOR USE  1. Sit on the edge of your bed if possible, or sit up as far as you can in bed or on a chair. 2. Hold the incentive spirometer in an upright position. 3. Breathe out normally. 4. Place the mouthpiece in your mouth and seal your lips tightly around it. 5. Breathe in slowly and as deeply as possible, raising the piston or the ball toward the top of the column. 6. Hold your breath for 3-5 seconds or for as long as possible. Allow the piston or ball to fall to the bottom of the column. 7. Remove the mouthpiece from your mouth and breathe out normally. 8. Rest for a few seconds and repeat Steps 1 through 7 at least 10 times every 1-2 hours when you are awake. Take your time and take a few normal breaths between deep breaths. 9. The spirometer may include an indicator to show your best effort. Use the indicator as a goal to work toward during each repetition. 10. After each set of 10 deep breaths, practice coughing to be sure your lungs are clear. If you have an incision (the cut made at the time of surgery), support your incision when coughing by placing a pillow or rolled up towels firmly against it. Once you are able to get out of bed,  walk around indoors and cough well. You may stop using the incentive spirometer when instructed by your caregiver.  RISKS AND COMPLICATIONS  Take your time so you do not get dizzy or light-headed.  If you are in pain, you may need to take or ask for pain medication before doing incentive spirometry. It is harder to take a deep breath if you  are having pain. AFTER USE  Rest and breathe slowly and easily.  It can be helpful to keep track of a log of your progress. Your caregiver can provide you with a simple table to help with this. If you are using the spirometer at home, follow these instructions: [place] IF:   You are having difficultly using the spirometer.  You have trouble using the spirometer as often as instructed.  Your pain medication is not giving enough relief while using the spirometer.  You develop fever of 100.5 F (38.1 C) or higher. SEEK IMMEDIATE MEDICAL CARE IF:   You cough up bloody sputum that had not been present before.  You develop fever of 102 F (38.9 C) or greater.  You develop worsening pain at or near the incision site. MAKE SURE YOU:   Understand these instructions.  Will watch your condition.  Will get help right away if you are not doing well or get worse. Document Released: 11/30/2006 Document Revised: 10/12/2011 Document Reviewed: 01/31/2007 [facility] Patient Information 2014 [place], [place].   ________________________________________________________________________

## 2018-08-16 NOTE — Progress Notes (Signed)
Pt. For preop 0800 08-16-18 please place orders in epic

## 2018-08-18 ENCOUNTER — Encounter: Payer: Self-pay | Admitting: Gastroenterology

## 2018-08-18 ENCOUNTER — Ambulatory Visit (AMBULATORY_SURGERY_CENTER): Payer: PPO | Admitting: Gastroenterology

## 2018-08-18 VITALS — BP 108/74 | HR 56 | Temp 98.6°F | Resp 14 | Ht 65.5 in | Wt 172.0 lb

## 2018-08-18 DIAGNOSIS — D122 Benign neoplasm of ascending colon: Secondary | ICD-10-CM

## 2018-08-18 DIAGNOSIS — Z86010 Personal history of colonic polyps: Secondary | ICD-10-CM | POA: Diagnosis not present

## 2018-08-18 DIAGNOSIS — Z8673 Personal history of transient ischemic attack (TIA), and cerebral infarction without residual deficits: Secondary | ICD-10-CM | POA: Diagnosis not present

## 2018-08-18 DIAGNOSIS — E669 Obesity, unspecified: Secondary | ICD-10-CM | POA: Diagnosis not present

## 2018-08-18 MED ORDER — SODIUM CHLORIDE 0.9 % IV SOLN: 500.0000 mL | Freq: Once | INTRAVENOUS | Status: DC

## 2018-08-18 NOTE — Progress Notes (Signed)
Called to room to assist during endoscopic procedure.  Patient ID and intended procedure confirmed with present staff. Received instructions for my participation in the procedure from the performing physician.  

## 2018-08-18 NOTE — Op Note (Signed)
[facility] Patient Name: Renee Haynes Procedure Date: 08/18/2018 9:16 AM MRN: [id number] Endoscopist: Mauri Pole , MD Age: 68 Referring MD:  Date of Birth: 12/19/1950 Gender: Female Account #: [id number] Procedure:                Colonoscopy Indications:              High risk colon cancer surveillance: Personal                            history of colonic polyps, Surveillance: Piecemeal                            removal of large sessile adenoma last colonoscopy                            (< 3 yrs) Medicines:                Monitored Anesthesia Care Procedure:                Pre-Anesthesia Assessment:                           - Prior to the procedure, a History and Physical                            was performed, and patient medications and                            allergies were reviewed. The patient's tolerance of                            previous anesthesia was also reviewed. The risks                            and benefits of the procedure and the sedation                            options and risks were discussed with the patient.                            All questions were answered, and informed consent                            was obtained. Prior Anticoagulants: The patient has                            taken no previous anticoagulant or antiplatelet                            agents. ASA Grade Assessment: II - A patient with                            mild systemic disease. After reviewing the risks  and benefits, the patient was deemed in                            satisfactory condition to undergo the procedure.                           After obtaining informed consent, the colonoscope                            was passed under direct vision. Throughout the                            procedure, the patient's blood pressure, pulse, and                            oxygen saturations were monitored continuously. The                             Model PCF-[id number] scope was introduced                            through the anus and advanced to the the cecum,                            identified by appendiceal orifice and ileocecal                            valve. The colonoscopy was performed without                            difficulty. The patient tolerated the procedure                            well. The quality of the bowel preparation was                            excellent. The ileocecal valve, appendiceal                            orifice, and rectum were photographed. Scope In: 9:32:29 AM Scope Out: 9:48:08 AM Scope Withdrawal Time: 0 hours 8 minutes 31 seconds  Total Procedure Duration: 0 hours 15 minutes 39 seconds  Findings:                 The perianal and digital rectal examinations were                            normal.                           A 10 mm polyp was found in the ascending colon. The                            polyp was sessile. The polyp was removed with a  cold snare. Resection and retrieval were complete.                           Multiple small and large-mouthed diverticula were                            found in the sigmoid colon and descending colon.                            There was evidence of diverticular spasm. There was                            evidence of an impacted diverticulum.                           Non-bleeding internal hemorrhoids were found during                            retroflexion. The hemorrhoids were small. Complications:            No immediate complications. Estimated Blood Loss:     Estimated blood loss was minimal. Impression:               - One 10 mm polyp in the ascending colon, removed                            with a cold snare. Resected and retrieved.                           - Moderate diverticulosis in the sigmoid colon and                            in the descending colon. There  was evidence of                            diverticular spasm. There was evidence of an                            impacted diverticulum.                           - Non-bleeding internal hemorrhoids. Recommendation:           - Patient has a contact number available for                            emergencies. The signs and symptoms of potential                            delayed complications were discussed with the                            patient. Return to normal activities tomorrow.  Written discharge instructions were provided to the                            patient.                           - Resume previous diet.                           - Continue present medications.                           - Await pathology results.                           - Repeat colonoscopy in 3 years for surveillance                            based on pathology results.                           - Return to GI clinic PRN. Mauri Pole, MD 08/18/2018 9:55:15 AM This report has been signed electronically.

## 2018-08-18 NOTE — Patient Instructions (Signed)
Please read handouts provided. Continue present medications. Await pathology results.     YOU HAD AN ENDOSCOPIC PROCEDURE TODAY AT THE [facility]:   Refer to the procedure report that was given to you for any specific questions about what was found during the examination.  If the procedure report does not answer your questions, please call your gastroenterologist to clarify.  If you requested that your care partner not be given the details of your procedure findings, then the procedure report has been included in a sealed envelope for you to review at your convenience later.  YOU SHOULD EXPECT: Some feelings of bloating in the abdomen. Passage of more gas than usual.  Walking can help get rid of the air that was put into your GI tract during the procedure and reduce the bloating. If you had a lower endoscopy (such as a colonoscopy or flexible sigmoidoscopy) you may notice spotting of blood in your stool or on the toilet paper. If you underwent a bowel prep for your procedure, you may not have a normal bowel movement for a few days.  Please Note:  You might notice some irritation and congestion in your nose or some drainage.  This is from the oxygen used during your procedure.  There is no need for concern and it should clear up in a day or so.  SYMPTOMS TO REPORT IMMEDIATELY:   Following lower endoscopy (colonoscopy or flexible sigmoidoscopy):  Excessive amounts of blood in the stool  Significant tenderness or worsening of abdominal pains  Swelling of the abdomen that is new, acute  Fever of 100F or higher    For urgent or emergent issues, a gastroenterologist can be reached at any hour by calling [phone].   DIET:  We do recommend a small meal at first, but then you may proceed to your regular diet.  Drink plenty of fluids but you should avoid alcoholic beverages for 24 hours.  ACTIVITY:  You should plan to take it easy for the rest of today and you should NOT DRIVE  or use heavy machinery until tomorrow (because of the sedation medicines used during the test).    FOLLOW UP: Our staff will call the number listed on your records the next business day following your procedure to check on you and address any questions or concerns that you may have regarding the information given to you following your procedure. If we do not reach you, we will leave a message.  However, if you are feeling well and you are not experiencing any problems, there is no need to return our call.  We will assume that you have returned to your regular daily activities without incident.  If any biopsies were taken you will be contacted by phone or by letter within the next 1-3 weeks.  Please call us at [phone] if you have not heard about the biopsies in 3 weeks.    SIGNATURES/CONFIDENTIALITY: You and/or your care partner have signed paperwork which will be entered into your electronic medical record.  These signatures attest to the fact that that the information above on your After Visit Summary has been reviewed and is understood.  Full responsibility of the confidentiality of this discharge information lies with you and/or your care-partner. 

## 2018-08-18 NOTE — Progress Notes (Signed)
Report to PACU, RN, vss, BBS= Clear.  

## 2018-08-19 ENCOUNTER — Telehealth: Payer: Self-pay

## 2018-08-19 NOTE — Telephone Encounter (Signed)
  Follow up Call-  Call back number 08/18/2018 09/17/2017  Post procedure Call Back phone  # [phone]  Permission to leave phone message Yes Yes  Some recent data might be hidden     Patient questions:  Do you have a fever, pain , or abdominal swelling? No. Pain Score  0 *  Have you tolerated food without any problems? Yes.    Have you been able to return to your normal activities? Yes.    Do you have any questions about your discharge instructions: Diet   No. Medications  No. Follow up visit  No.  Do you have questions or concerns about your Care? No.  Actions: * If pain score is 4 or above: No action needed, pain <4.

## 2018-08-22 DIAGNOSIS — B351 Tinea unguium: Secondary | ICD-10-CM | POA: Diagnosis not present

## 2018-08-22 DIAGNOSIS — Z79899 Other long term (current) drug therapy: Secondary | ICD-10-CM | POA: Diagnosis not present

## 2018-08-23 ENCOUNTER — Encounter: Payer: Self-pay | Admitting: Gastroenterology

## 2018-08-24 DIAGNOSIS — L603 Nail dystrophy: Secondary | ICD-10-CM | POA: Diagnosis not present

## 2018-08-24 DIAGNOSIS — L853 Xerosis cutis: Secondary | ICD-10-CM | POA: Diagnosis not present

## 2018-08-24 DIAGNOSIS — D225 Melanocytic nevi of trunk: Secondary | ICD-10-CM | POA: Diagnosis not present

## 2018-08-24 DIAGNOSIS — L720 Epidermal cyst: Secondary | ICD-10-CM | POA: Diagnosis not present

## 2018-08-24 DIAGNOSIS — L814 Other melanin hyperpigmentation: Secondary | ICD-10-CM | POA: Diagnosis not present

## 2018-08-24 DIAGNOSIS — L821 Other seborrheic keratosis: Secondary | ICD-10-CM | POA: Diagnosis not present

## 2018-08-24 DIAGNOSIS — D1801 Hemangioma of skin and subcutaneous tissue: Secondary | ICD-10-CM | POA: Diagnosis not present

## 2018-08-24 DIAGNOSIS — Z85828 Personal history of other malignant neoplasm of skin: Secondary | ICD-10-CM | POA: Diagnosis not present

## 2018-08-26 ENCOUNTER — Encounter (HOSPITAL_COMMUNITY)
Admission: AD | Disposition: A | Discharge status: Home Health | Payer: Self-pay | Source: Home / Self Care | Attending: Orthopaedic Surgery

## 2018-08-26 ENCOUNTER — Encounter (HOSPITAL_COMMUNITY): Payer: Self-pay

## 2018-08-26 ENCOUNTER — Observation Stay (HOSPITAL_COMMUNITY): Payer: PPO

## 2018-08-26 ENCOUNTER — Ambulatory Visit (HOSPITAL_COMMUNITY): Payer: PPO | Admitting: Certified Registered Nurse Anesthetist

## 2018-08-26 ENCOUNTER — Other Ambulatory Visit: Payer: Self-pay

## 2018-08-26 ENCOUNTER — Inpatient Hospital Stay (HOSPITAL_COMMUNITY)
Admission: AD | Admit: 2018-08-26 | Discharge: 2018-08-29 | DRG: 470 | Disposition: A | Discharge status: Home Health | Payer: PPO | Attending: Orthopaedic Surgery | Admitting: Orthopaedic Surgery

## 2018-08-26 ENCOUNTER — Ambulatory Visit (HOSPITAL_COMMUNITY): Payer: PPO | Admitting: Physician Assistant

## 2018-08-26 DIAGNOSIS — Z882 Allergy status to sulfonamides status: Secondary | ICD-10-CM

## 2018-08-26 DIAGNOSIS — G8918 Other acute postprocedural pain: Secondary | ICD-10-CM | POA: Diagnosis not present

## 2018-08-26 DIAGNOSIS — E039 Hypothyroidism, unspecified: Secondary | ICD-10-CM | POA: Diagnosis present

## 2018-08-26 DIAGNOSIS — Z880 Allergy status to penicillin: Secondary | ICD-10-CM

## 2018-08-26 DIAGNOSIS — Z96651 Presence of right artificial knee joint: Secondary | ICD-10-CM | POA: Diagnosis present

## 2018-08-26 DIAGNOSIS — K219 Gastro-esophageal reflux disease without esophagitis: Secondary | ICD-10-CM | POA: Diagnosis present

## 2018-08-26 DIAGNOSIS — Z85828 Personal history of other malignant neoplasm of skin: Secondary | ICD-10-CM

## 2018-08-26 DIAGNOSIS — M1712 Unilateral primary osteoarthritis, left knee: Secondary | ICD-10-CM | POA: Diagnosis present

## 2018-08-26 DIAGNOSIS — Z96652 Presence of left artificial knee joint: Secondary | ICD-10-CM

## 2018-08-26 DIAGNOSIS — Z471 Aftercare following joint replacement surgery: Secondary | ICD-10-CM | POA: Diagnosis not present

## 2018-08-26 DIAGNOSIS — M21162 Varus deformity, not elsewhere classified, left knee: Secondary | ICD-10-CM | POA: Diagnosis not present

## 2018-08-26 HISTORY — PX: TOTAL KNEE ARTHROPLASTY: SHX125

## 2018-08-26 SURGERY — ARTHROPLASTY, KNEE, TOTAL
Anesthesia: Regional | Site: Knee | Laterality: Left

## 2018-08-26 MED ORDER — PROPOFOL 10 MG/ML IV BOLUS
INTRAVENOUS | Status: AC
  Filled 2018-08-26: qty 40

## 2018-08-26 MED ORDER — BUPIVACAINE IN DEXTROSE 0.75-8.25 % IT SOLN
INTRATHECAL | Status: DC | PRN
  Administered 2018-08-26: 1.6 mL via INTRATHECAL

## 2018-08-26 MED ORDER — ONDANSETRON HCL 4 MG/2ML IJ SOLN
4.0000 mg | Freq: Four times a day (QID) | INTRAMUSCULAR | Status: DC | PRN
  Administered 2018-08-27: 4 mg via INTRAVENOUS
  Filled 2018-08-26: qty 2

## 2018-08-26 MED ORDER — MIDAZOLAM HCL 5 MG/5ML IJ SOLN
INTRAMUSCULAR | Status: DC | PRN
  Administered 2018-08-26: 1 mg via INTRAVENOUS

## 2018-08-26 MED ORDER — LEVOTHYROXINE SODIUM 88 MCG PO TABS
88.0000 ug | ORAL_TABLET | Freq: Every day | ORAL | Status: DC
  Administered 2018-08-27 – 2018-08-29 (×3): 88 ug via ORAL
  Filled 2018-08-26 (×3): qty 1

## 2018-08-26 MED ORDER — FENTANYL CITRATE (PF) 100 MCG/2ML IJ SOLN
50.0000 ug | INTRAMUSCULAR | Status: DC
  Filled 2018-08-26: qty 2

## 2018-08-26 MED ORDER — ASPIRIN EC 325 MG PO TBEC
325.0000 mg | DELAYED_RELEASE_TABLET | Freq: Two times a day (BID) | ORAL | Status: DC
  Administered 2018-08-26 – 2018-08-29 (×6): 325 mg via ORAL
  Filled 2018-08-26 (×6): qty 1

## 2018-08-26 MED ORDER — OXYCODONE HCL 5 MG PO TABS
10.0000 mg | ORAL_TABLET | ORAL | Status: DC | PRN
  Administered 2018-08-26: 15 mg via ORAL
  Administered 2018-08-27: 10 mg via ORAL
  Administered 2018-08-27: 15 mg via ORAL
  Administered 2018-08-27: 10 mg via ORAL
  Filled 2018-08-26 (×2): qty 3
  Filled 2018-08-26: qty 2

## 2018-08-26 MED ORDER — ZOLPIDEM TARTRATE 5 MG PO TABS: 5.0000 mg | ORAL_TABLET | Freq: Every evening | ORAL | Status: DC | PRN

## 2018-08-26 MED ORDER — PROPOFOL 500 MG/50ML IV EMUL
INTRAVENOUS | Status: DC | PRN
  Administered 2018-08-26: 125 ug/kg/min via INTRAVENOUS

## 2018-08-26 MED ORDER — DOCUSATE SODIUM 100 MG PO CAPS
100.0000 mg | ORAL_CAPSULE | Freq: Two times a day (BID) | ORAL | Status: DC
  Administered 2018-08-26 – 2018-08-29 (×6): 100 mg via ORAL
  Filled 2018-08-26 (×6): qty 1

## 2018-08-26 MED ORDER — ALUM & MAG HYDROXIDE-SIMETH 200-200-20 MG/5ML PO SUSP: 30.0000 mL | ORAL | Status: DC | PRN

## 2018-08-26 MED ORDER — OXYCODONE HCL 5 MG PO TABS
5.0000 mg | ORAL_TABLET | ORAL | Status: DC | PRN
  Administered 2018-08-26 – 2018-08-27 (×2): 5 mg via ORAL
  Administered 2018-08-27: 10 mg via ORAL
  Filled 2018-08-26: qty 2
  Filled 2018-08-26: qty 1
  Filled 2018-08-26: qty 2
  Filled 2018-08-26: qty 1

## 2018-08-26 MED ORDER — METOCLOPRAMIDE HCL 5 MG/ML IJ SOLN: 5.0000 mg | Freq: Three times a day (TID) | INTRAMUSCULAR | Status: DC | PRN

## 2018-08-26 MED ORDER — CLINDAMYCIN PHOSPHATE 900 MG/50ML IV SOLN
900.0000 mg | INTRAVENOUS | Status: AC
  Administered 2018-08-26: 900 mg via INTRAVENOUS
  Filled 2018-08-26: qty 50

## 2018-08-26 MED ORDER — DIPHENHYDRAMINE HCL 12.5 MG/5ML PO ELIX: 12.5000 mg | ORAL_SOLUTION | ORAL | Status: DC | PRN

## 2018-08-26 MED ORDER — METHOCARBAMOL 500 MG PO TABS
500.0000 mg | ORAL_TABLET | Freq: Four times a day (QID) | ORAL | Status: DC | PRN
  Administered 2018-08-26 – 2018-08-29 (×7): 500 mg via ORAL
  Filled 2018-08-26 (×7): qty 1

## 2018-08-26 MED ORDER — EPHEDRINE SULFATE-NACL 50-0.9 MG/10ML-% IV SOSY
PREFILLED_SYRINGE | INTRAVENOUS | Status: DC | PRN
  Administered 2018-08-26: 10 mg via INTRAVENOUS
  Administered 2018-08-26: 15 mg via INTRAVENOUS

## 2018-08-26 MED ORDER — CHLORHEXIDINE GLUCONATE 4 % EX LIQD: 60.0000 mL | Freq: Once | CUTANEOUS | Status: DC

## 2018-08-26 MED ORDER — HYDROMORPHONE HCL 1 MG/ML IJ SOLN: 0.5000 mg | INTRAMUSCULAR | Status: DC | PRN

## 2018-08-26 MED ORDER — PROPOFOL 10 MG/ML IV BOLUS
INTRAVENOUS | Status: DC | PRN
  Administered 2018-08-26: 40 mg via INTRAVENOUS

## 2018-08-26 MED ORDER — METHOCARBAMOL 1000 MG/10ML IJ SOLN
500.0000 mg | Freq: Four times a day (QID) | INTRAVENOUS | Status: DC | PRN
  Filled 2018-08-26: qty 5

## 2018-08-26 MED ORDER — CLINDAMYCIN PHOSPHATE 600 MG/50ML IV SOLN
600.0000 mg | Freq: Four times a day (QID) | INTRAVENOUS | Status: AC
  Administered 2018-08-26 – 2018-08-27 (×2): 600 mg via INTRAVENOUS
  Filled 2018-08-26 (×3): qty 50

## 2018-08-26 MED ORDER — MENTHOL 3 MG MT LOZG: 1.0000 | LOZENGE | OROMUCOSAL | Status: DC | PRN

## 2018-08-26 MED ORDER — SODIUM CHLORIDE 0.9 % IV SOLN
INTRAVENOUS | Status: DC
  Administered 2018-08-26 – 2018-08-27 (×3): via INTRAVENOUS

## 2018-08-26 MED ORDER — SODIUM CHLORIDE 0.9 % IR SOLN
Status: DC | PRN
  Administered 2018-08-26: 1000 mL

## 2018-08-26 MED ORDER — DEXAMETHASONE SODIUM PHOSPHATE 10 MG/ML IJ SOLN
INTRAMUSCULAR | Status: DC | PRN
  Administered 2018-08-26: 10 mg via INTRAVENOUS

## 2018-08-26 MED ORDER — MIDAZOLAM HCL 2 MG/2ML IJ SOLN
INTRAMUSCULAR | Status: AC
  Filled 2018-08-26: qty 2

## 2018-08-26 MED ORDER — METOCLOPRAMIDE HCL 5 MG PO TABS: 5.0000 mg | ORAL_TABLET | Freq: Three times a day (TID) | ORAL | Status: DC | PRN

## 2018-08-26 MED ORDER — DEXAMETHASONE SODIUM PHOSPHATE 10 MG/ML IJ SOLN
INTRAMUSCULAR | Status: AC
  Filled 2018-08-26: qty 1

## 2018-08-26 MED ORDER — PHENOL 1.4 % MT LIQD: 1.0000 | OROMUCOSAL | Status: DC | PRN

## 2018-08-26 MED ORDER — ONDANSETRON HCL 4 MG/2ML IJ SOLN: 4.0000 mg | Freq: Once | INTRAMUSCULAR | Status: DC | PRN

## 2018-08-26 MED ORDER — ACETAMINOPHEN 325 MG PO TABS
325.0000 mg | ORAL_TABLET | Freq: Four times a day (QID) | ORAL | Status: DC | PRN
  Administered 2018-08-28 – 2018-08-29 (×2): 650 mg via ORAL
  Filled 2018-08-26 (×2): qty 2

## 2018-08-26 MED ORDER — MIDAZOLAM HCL 2 MG/2ML IJ SOLN
0.5000 mg | INTRAMUSCULAR | Status: DC
  Administered 2018-08-26: 1 mg via INTRAVENOUS
  Filled 2018-08-26: qty 2

## 2018-08-26 MED ORDER — ONDANSETRON HCL 4 MG/2ML IJ SOLN
INTRAMUSCULAR | Status: DC | PRN
  Administered 2018-08-26: 4 mg via INTRAVENOUS

## 2018-08-26 MED ORDER — LACTATED RINGERS IV SOLN
INTRAVENOUS | Status: DC
  Administered 2018-08-26: 1000 mL via INTRAVENOUS
  Administered 2018-08-26: 11:00:00 via INTRAVENOUS

## 2018-08-26 MED ORDER — FENTANYL CITRATE (PF) 100 MCG/2ML IJ SOLN: 25.0000 ug | INTRAMUSCULAR | Status: DC | PRN

## 2018-08-26 MED ORDER — POLYETHYLENE GLYCOL 3350 17 G PO PACK: 17.0000 g | PACK | Freq: Every day | ORAL | Status: DC | PRN

## 2018-08-26 MED ORDER — ROPIVACAINE HCL 5 MG/ML IJ SOLN
INTRAMUSCULAR | Status: DC | PRN
  Administered 2018-08-26: 30 mL via PERINEURAL

## 2018-08-26 MED ORDER — PROPOFOL 10 MG/ML IV BOLUS
INTRAVENOUS | Status: AC
  Filled 2018-08-26: qty 20

## 2018-08-26 MED ORDER — ONDANSETRON HCL 4 MG PO TABS
4.0000 mg | ORAL_TABLET | Freq: Four times a day (QID) | ORAL | Status: DC | PRN
  Administered 2018-08-27: 4 mg via ORAL
  Filled 2018-08-26: qty 1

## 2018-08-26 MED ORDER — SODIUM CHLORIDE 0.9 % IR SOLN
Status: DC | PRN
  Administered 2018-08-26: 2000 mL

## 2018-08-26 MED ORDER — PROPOFOL 10 MG/ML IV BOLUS
INTRAVENOUS | Status: AC
  Filled 2018-08-26: qty 60

## 2018-08-26 MED ORDER — TRANEXAMIC ACID-NACL 1000-0.7 MG/100ML-% IV SOLN
1000.0000 mg | INTRAVENOUS | Status: AC
  Administered 2018-08-26: 1000 mg via INTRAVENOUS
  Filled 2018-08-26: qty 100

## 2018-08-26 MED ORDER — PANTOPRAZOLE SODIUM 40 MG PO TBEC
40.0000 mg | DELAYED_RELEASE_TABLET | Freq: Every day | ORAL | Status: DC
  Administered 2018-08-27 – 2018-08-29 (×3): 40 mg via ORAL
  Filled 2018-08-26 (×3): qty 1

## 2018-08-26 MED ORDER — ONDANSETRON HCL 4 MG/2ML IJ SOLN
INTRAMUSCULAR | Status: AC
  Filled 2018-08-26: qty 2

## 2018-08-26 SURGICAL SUPPLY — 59 items
BAG ZIPLOCK 12X15 (MISCELLANEOUS) IMPLANT
BANDAGE ACE 6X5 VEL STRL LF (GAUZE/BANDAGES/DRESSINGS) ×3 IMPLANT
BANDAGE ELASTIC 6 VELCRO ST LF (GAUZE/BANDAGES/DRESSINGS) ×3 IMPLANT
BASEPLATE TIBIAL TRIATHALON 2 (Plate) ×3 IMPLANT
BEARIN TIBIAL TRIATH SZ 2X11M (Knees) ×3 IMPLANT
BEARING TIBIAL TRIATH SZ 2X11M (Knees) ×1 IMPLANT
BENZOIN TINCTURE PRP APPL 2/3 (GAUZE/BANDAGES/DRESSINGS) ×3 IMPLANT
BLADE SAG 18X100X1.27 (BLADE) IMPLANT
BLADE SURG SZ10 CARB STEEL (BLADE) ×6 IMPLANT
BOWL SMART MIX CTS (DISPOSABLE) IMPLANT
CEMENT BONE SIMPLEX SPEEDSET (Cement) ×6 IMPLANT
CLOSURE WOUND 1/2 X4 (GAUZE/BANDAGES/DRESSINGS)
COVER SURGICAL LIGHT HANDLE (MISCELLANEOUS) ×3 IMPLANT
COVER WAND RF STERILE (DRAPES) IMPLANT
CUFF TOURN SGL QUICK 34 (TOURNIQUET CUFF) ×2
CUFF TRNQT CYL 34X4X40X1 (TOURNIQUET CUFF) ×1 IMPLANT
DECANTER SPIKE VIAL GLASS SM (MISCELLANEOUS) IMPLANT
DRAPE U-SHAPE 47X51 STRL (DRAPES) ×3 IMPLANT
DRSG PAD ABDOMINAL 8X10 ST (GAUZE/BANDAGES/DRESSINGS) ×3 IMPLANT
DURAPREP 26ML APPLICATOR (WOUND CARE) ×3 IMPLANT
ELECT REM PT RETURN 15FT ADLT (MISCELLANEOUS) ×3 IMPLANT
FEMORAL PEG DISTAL FIXATION (Orthopedic Implant) ×3 IMPLANT
FEMORAL TRIATH POST STAB  SZ3 (Orthopedic Implant) ×2 IMPLANT
FEMORAL TRIATH POST STAB SZ3 (Orthopedic Implant) ×1 IMPLANT
GAUZE SPONGE 4X4 12PLY STRL (GAUZE/BANDAGES/DRESSINGS) ×3 IMPLANT
GAUZE XEROFORM 1X8 LF (GAUZE/BANDAGES/DRESSINGS) ×3 IMPLANT
GLOVE BIO SURGEON STRL SZ7.5 (GLOVE) ×3 IMPLANT
GLOVE BIOGEL PI IND STRL 8 (GLOVE) ×2 IMPLANT
GLOVE BIOGEL PI INDICATOR 8 (GLOVE) ×4
GLOVE ECLIPSE 8.0 STRL XLNG CF (GLOVE) ×3 IMPLANT
GOWN STRL REUS W/TWL XL LVL3 (GOWN DISPOSABLE) ×6 IMPLANT
HANDPIECE INTERPULSE COAX TIP (DISPOSABLE) ×2
HOLDER FOLEY CATH W/STRAP (MISCELLANEOUS) IMPLANT
IMMOBILIZER KNEE 20 (SOFTGOODS) ×6 IMPLANT
IMMOBILIZER KNEE 20 THIGH 36 (SOFTGOODS) ×1 IMPLANT
NDL SAFETY ECLIPSE 18X1.5 (NEEDLE) IMPLANT
NEEDLE HYPO 18GX1.5 SHARP (NEEDLE)
NS IRRIG 1000ML POUR BTL (IV SOLUTION) ×3 IMPLANT
PACK TOTAL KNEE CUSTOM (KITS) ×3 IMPLANT
PAD ABD 7.5X8 STRL (GAUZE/BANDAGES/DRESSINGS) ×3 IMPLANT
PADDING CAST COTTON 6X4 STRL (CAST SUPPLIES) ×3 IMPLANT
PATELLA TRIATHLON SZ 29 9 MM (Orthopedic Implant) ×3 IMPLANT
PROTECTOR NERVE ULNAR (MISCELLANEOUS) ×3 IMPLANT
SET HNDPC FAN SPRY TIP SCT (DISPOSABLE) ×1 IMPLANT
SET PAD KNEE POSITIONER (MISCELLANEOUS) ×3 IMPLANT
STAPLER VISISTAT 35W (STAPLE) IMPLANT
STRIP CLOSURE SKIN 1/2X4 (GAUZE/BANDAGES/DRESSINGS) IMPLANT
SUT MNCRL AB 4-0 PS2 18 (SUTURE) IMPLANT
SUT VIC AB 0 CT1 27 (SUTURE) ×2
SUT VIC AB 0 CT1 27XBRD ANTBC (SUTURE) ×1 IMPLANT
SUT VIC AB 1 CT1 36 (SUTURE) ×6 IMPLANT
SUT VIC AB 2-0 CT1 27 (SUTURE) ×4
SUT VIC AB 2-0 CT1 TAPERPNT 27 (SUTURE) ×2 IMPLANT
SYR 3ML LL SCALE MARK (SYRINGE) IMPLANT
TAPE STRIPS DRAPE STRL (GAUZE/BANDAGES/DRESSINGS) ×3 IMPLANT
TRAY FOLEY MTR SLVR 16FR STAT (SET/KITS/TRAYS/PACK) ×3 IMPLANT
WATER STERILE IRR 1000ML POUR (IV SOLUTION) ×3 IMPLANT
WRAP KNEE MAXI GEL POST OP (GAUZE/BANDAGES/DRESSINGS) ×3 IMPLANT
YANKAUER SUCT BULB TIP 10FT TU (MISCELLANEOUS) ×3 IMPLANT

## 2018-08-26 NOTE — Anesthesia Postprocedure Evaluation (Signed)
Anesthesia Post Note  Patient: Renee Haynes  Procedure(s) Performed: LEFT TOTAL KNEE ARTHROPLASTY (Left Knee)     Patient location during evaluation: PACU Anesthesia Type: Regional and Spinal Level of consciousness: oriented and awake and alert Pain management: pain level controlled Vital Signs Assessment: post-procedure vital signs reviewed and stable Respiratory status: spontaneous breathing, respiratory function stable and patient connected to nasal cannula oxygen Cardiovascular status: blood pressure returned to baseline and stable Postop Assessment: no headache, no backache, no apparent nausea or vomiting and spinal receding Anesthetic complications: no    Last Vitals:  Vitals:   08/26/18 1440 08/26/18 1549  BP: 115/80 124/79  Pulse: 77 75  Resp: 18 16  Temp: (!) 36.4 C   SpO2: 98% 96%    Last Pain:  Vitals:   08/26/18 1440  TempSrc: Oral  PainSc:     LLE Motor Response: Purposeful movement (08/26/18 1540) LLE Sensation: Full sensation (08/26/18 1540) RLE Motor Response: Purposeful movement (08/26/18 1540) RLE Sensation: Full sensation (08/26/18 1540) L Sensory Level: S1-Sole of foot, small toes (08/26/18 1540) R Sensory Level: S1-Sole of foot, small toes (08/26/18 1540)  Tag Wurtz P Billi Bright

## 2018-08-26 NOTE — H&P (Signed)
TOTAL KNEE ADMISSION H&P  Patient is being admitted for left total knee arthroplasty.  Subjective:  Chief Complaint:left knee pain.  HPI: Renee Haynes, 68 y.o. female, has a history of pain and functional disability in the left knee due to arthritis and has failed non-surgical conservative treatments for greater than 12 weeks to includeNSAID's and/or analgesics, corticosteriod injections, viscosupplementation injections, flexibility and strengthening excercises, use of assistive devices and activity modification.  Onset of symptoms was gradual, starting 5 years ago with gradually worsening course since that time. The patient noted no past surgery on the left knee(s).  Patient currently rates pain in the left knee(s) at 10 out of 10 with activity. Patient has night pain, worsening of pain with activity and weight bearing, pain that interferes with activities of daily living, pain with passive range of motion, crepitus and joint swelling.  Patient has evidence of subchondral sclerosis, periarticular osteophytes and joint space narrowing by imaging studies. There is no active infection.  Patient Active Problem List   Diagnosis Date Noted  . Unilateral primary osteoarthritis, left knee 08/15/2018  . Sprain of unspecified ligament of right ankle, initial encounter 06/29/2016  . Rectocele 06/10/2011  . Abnormal ECG 03/26/2011  . Dyspnea on exertion 03/26/2011   Past Medical History:  Diagnosis Date  . Abnormal EKG    recent w/u and neg echo  . Cancer ([place])    BCC chest and Squamous cell on chest  . Dysrhythmia    right bundle blanch block  . GERD (gastroesophageal reflux disease)   . History of kidney stones   . Hypothyroidism    hypothyroidism  . Osteoarthritis of right knee   . Pneumonia     Past Surgical History:  Procedure Laterality Date  . ABDOMINAL HYSTERECTOMY  2006  . COLONOSCOPY    . KNEE ARTHROPLASTY Right   . POLYPECTOMY    . RECTOCELE REPAIR  06/10/2011   Procedure:  POSTERIOR REPAIR (RECTOCELE);  Surgeon: Elveria Royals;  Location: [facility];  Service: Gynecology;  Laterality: N/A;  . TOTAL KNEE ARTHROPLASTY Right 2009  . UPPER GASTROINTESTINAL ENDOSCOPY      No current facility-administered medications for this encounter.    Allergies  Allergen Reactions  . Penicillins Cross Reactors Rash    DID THE REACTION INVOLVE: Swelling of the face/tongue/throat, SOB, or low BP? No Sudden or severe rash/hives, skin peeling, or the inside of the mouth or nose? No Did it require medical treatment? No When did it last happen?25+ years If all above answers are "NO", may proceed with cephalosporin use.   . Sulfa Drugs Cross Reactors Rash    Social History   Tobacco Use  . Smoking status: Never Smoker  . Smokeless tobacco: Never Used  Substance Use Topics  . Alcohol use: Yes    Alcohol/week: 2.0 standard drinks    Types: 2 Glasses of wine per week    Comment: occasional    Family History  Problem Relation Age of Onset  . Breast cancer Sister   . Breast cancer Maternal Grandmother   . Colon polyps Mother   . Colon cancer Neg Hx   . Esophageal cancer Neg Hx   . Rectal cancer Neg Hx   . Stomach cancer Neg Hx      Review of Systems  Musculoskeletal: Positive for joint pain.  All other systems reviewed and are negative.   Objective:  Physical Exam  Constitutional: She is oriented to person, place, and time. She appears well-developed and well-nourished.  HENT:  Head: Normocephalic and atraumatic.  Eyes: Pupils are equal, round, and reactive to light. EOM are normal.  Neck: Normal range of motion. Neck supple.  Cardiovascular: Normal rate and regular rhythm.  Respiratory: Effort normal and breath sounds normal.  GI: Soft. Bowel sounds are normal.  Musculoskeletal:     Left knee: She exhibits decreased range of motion, swelling, effusion, abnormal alignment, bony tenderness and abnormal meniscus. Tenderness found. Medial joint line and  lateral joint line tenderness noted.  Neurological: She is alert and oriented to person, place, and time.  Skin: Skin is warm and dry.  Psychiatric: She has a normal mood and affect.    Vital signs in last 24 hours:    Labs:   Estimated body mass index is 28.19 kg/m as calculated from the following:   Height as of 08/18/18: 5' 5.5" (1.664 m).   Weight as of 08/18/18: 78 kg.   Imaging Review Plain radiographs demonstrate severe degenerative joint disease of the left knee(s). The overall alignment ismild varus. The bone quality appears to be good for age and reported activity level.   Preoperative templating of the joint replacement has been completed, documented, and submitted to the Operating Room personnel in order to optimize intra-operative equipment management.    Patient's anticipated LOS is less than 2 midnights, meeting these requirements: - Younger than 24 - Lives within 1 hour of care - Has a competent adult at home to recover with post-op recover - NO history of  - Chronic pain requiring opiods  - Diabetes  - Coronary Artery Disease  - Heart failure  - Heart attack  - Stroke  - DVT/VTE  - Cardiac arrhythmia  - Respiratory Failure/COPD  - Renal failure  - Anemia  - Advanced Liver disease        Assessment/Plan:  End stage arthritis, left knee   The patient history, physical examination, clinical judgment of the provider and imaging studies are consistent with end stage degenerative joint disease of the left knee(s) and total knee arthroplasty is deemed medically necessary. The treatment options including medical management, injection therapy arthroscopy and arthroplasty were discussed at length. The risks and benefits of total knee arthroplasty were presented and reviewed. The risks due to aseptic loosening, infection, stiffness, patella tracking problems, thromboembolic complications and other imponderables were discussed. The patient acknowledged the  explanation, agreed to proceed with the plan and consent was signed. Patient is being admitted for inpatient treatment for surgery, pain control, PT, OT, prophylactic antibiotics, VTE prophylaxis, progressive ambulation and ADL's and discharge planning. The patient is planning to be discharged home with home health services

## 2018-08-26 NOTE — Anesthesia Procedure Notes (Signed)
Anesthesia Regional Block: Adductor canal block   Pre-Anesthetic Checklist: ,, timeout performed, Correct Patient, Correct Site, Correct Laterality, Correct Procedure,, site marked, risks and benefits discussed, Surgical consent,  Pre-op evaluation,  At surgeon's request and post-op pain management  Laterality: Left  Prep: chloraprep       Needles:  Injection technique: Single-shot  Needle Type: Echogenic Stimulator Needle     Needle Length: 9cm  Needle Gauge: 21     Additional Needles:   Procedures:,,,, ultrasound used (permanent image in chart),,,,  Narrative:  Start time: 08/26/2018 10:45 AM End time: 08/26/2018 10:55 AM Injection made incrementally with aspirations every 5 mL.  Performed by: Personally  Anesthesiologist: Murvin Natal, MD  Additional Notes: Functioning IV was confirmed and monitors were applied. A time-out was performed. Hand hygiene and sterile gloves were used. The thigh was placed in a frog-leg position and prepped in a sterile fashion. A 21mm 21ga Arrow echogenic stimulator needle was placed using ultrasound guidance.  Negative aspiration and negative test dose prior to incremental administration of local anesthetic. The patient tolerated the procedure well.

## 2018-08-26 NOTE — Plan of Care (Signed)
Pt alert and oriented, pain well controlled. Up in chair with PT after surgery.  Tolerated well, RN will monitor.

## 2018-08-26 NOTE — Anesthesia Procedure Notes (Signed)
Spinal  Patient location during procedure: OR Start time: 08/26/2018 11:31 AM Staffing Resident/CRNA: [place] Shar Paez C, CRNA Performed: resident/CRNA  Preanesthetic Checklist Completed: patient identified, site marked, surgical consent, pre-op evaluation, timeout performed, IV checked, risks and benefits discussed and monitors and equipment checked Spinal Block Patient position: sitting Prep: site prepped and draped and DuraPrep Patient monitoring: heart rate, continuous pulse ox and blood pressure Approach: left paramedian Location: L3-4 Injection technique: single-shot Needle Needle type: Pencan  Needle gauge: 24 G Needle length: 9 cm Assessment Sensory level: T6 Additional Notes Expiration date of kit checked and confirmed. Patient tolerated procedure well, without complications.

## 2018-08-26 NOTE — Transfer of Care (Signed)
Immediate Anesthesia Transfer of Care Note  Patient: Renee Haynes  Procedure(s) Performed: LEFT TOTAL KNEE ARTHROPLASTY (Left Knee)  Patient Location: PACU  Anesthesia Type:Spinal  Level of Consciousness: awake, alert  and oriented  Airway & Oxygen Therapy: Patient Spontanous Breathing and Patient connected to face mask oxygen  Post-op Assessment: Report given to RN and Post -op Vital signs reviewed and stable  Post vital signs: Reviewed and stable  Last Vitals:  Vitals Value Taken Time  BP 110/65 08/26/2018  1:30 PM  Temp 36.4 C 08/26/2018  1:29 PM  Pulse 77 08/26/2018  1:31 PM  Resp 12 08/26/2018  1:31 PM  SpO2 94 % 08/26/2018  1:31 PM  Vitals shown include unvalidated device data.  Last Pain:  Vitals:   08/26/18 1051  TempSrc:   PainSc: 0-No pain      Patients Stated Pain Goal: 4 (85/27/78 2423)  Complications: No apparent anesthesia complications

## 2018-08-26 NOTE — Evaluation (Signed)
Physical Therapy Evaluation Patient Details Name: Renee Haynes MRN: [id number] DOB: 07-Mar-1951 Today's Date: 08/26/2018   History of Present Illness  68 yo female s/p L TKR on 08/26/18. PMH includes DOE, cancer, GERD, OA, R TKR 2009, posterior rectocele repair.   Clinical Impression  Pt presents with L knee pain, decreased L knee ROM, difficulty performing and increased time for mobility tasks, and decreased tolerance for ambulation due to L knee pain. Pt to benefit from acute PT to address deficits. Pt ambulated 100 ft with RW with min guard assist, verbal cuing used throughout for safety and proper form. Pt educated on ankle pumps (20/hour) to perform this afternoon/evening to increase circulation, to pt's tolerance and limited by pain. Per pt, she d/c HHPT vs SNF, pending pt progress. Pt lives alone and is nervous about mobility at home. PT to progress mobility as tolerated, and will continue to follow acutely.        Follow Up Recommendations Follow surgeon's recommendation for DC plan and follow-up therapies;Supervision for mobility/OOB    Equipment Recommendations  Rolling walker with 5" wheels    Recommendations for Other Services       Precautions / Restrictions Precautions Precautions: Fall Restrictions Weight Bearing Restrictions: No Other Position/Activity Restrictions: WBAT       Mobility  Bed Mobility Overal bed mobility: Needs Assistance Bed Mobility: Supine to Sit     Supine to sit: Min assist;HOB elevated     General bed mobility comments: Min assist for LLE management. Verbal cuing for sequencing.   Transfers Overall transfer level: Needs assistance   Transfers: Sit to/from Stand Sit to Stand: Min guard;From elevated surface         General transfer comment: Min guard for safety. Verbal cuing for hand placement, increased time to rise.   Ambulation/Gait Ambulation/Gait assistance: Min guard;+2 safety/equipment Gait Distance (Feet): 100  Feet Assistive device: Rolling walker (2 wheeled) Gait Pattern/deviations: Step-to pattern;Decreased weight shift to left;Decreased stride length Gait velocity: slightly decr    General Gait Details: Min guard for safety. Verbal cuing for placement in RW, turning, seuqencing.  Stairs            Wheelchair Mobility    Modified Rankin (Stroke Patients Only)       Balance Overall balance assessment: Mild deficits observed, not formally tested                                           Pertinent Vitals/Pain Pain Assessment: Faces Faces Pain Scale: Hurts little more Pain Location: L knee  Pain Descriptors / Indicators: Sore Pain Intervention(s): Limited activity within patient's tolerance;Repositioned;Ice applied;Monitored during session;Premedicated before session    Home Living Family/patient expects to be discharged to:: Private residence Living Arrangements: Alone Available Help at Discharge: Family;Available PRN/intermittently Type of Home: House Home Access: Stairs to enter Entrance Stairs-Rails: None Entrance Stairs-Number of Steps: 3 Home Layout: Multi-level Home Equipment: None;Other (comment)(toilet grab bar frame she borrowed from friend ) Additional Comments: Pt reports she might have a RW somewhere from 10+ years ago, unsure of where it is     Prior Function Level of Independence: Independent               Hand Dominance   Dominant Hand: Left    Extremity/Trunk Assessment   Upper Extremity Assessment Upper Extremity Assessment: Overall WFL for tasks assessed    Lower  Extremity Assessment Lower Extremity Assessment: Overall WFL for tasks assessed;LLE deficits/detail LLE Deficits / Details: suspected post-surgical weakness; able to perform ankle pumps, SLR with some quad lag, quad sets, heel slides to 60*, limited by pain  LLE Sensation: WNL    Cervical / Trunk Assessment Cervical / Trunk Assessment: Normal  Communication    Communication: No difficulties  Cognition Arousal/Alertness: Awake/alert Behavior During Therapy: WFL for tasks assessed/performed Overall Cognitive Status: Within Functional Limits for tasks assessed                                        General Comments      Exercises Total Joint Exercises Goniometric ROM: knee aarom ~5-60*, limited by pain and stiffness   Assessment/Plan    PT Assessment Patient needs continued PT services  PT Problem List Decreased strength;Pain;Decreased range of motion;Decreased activity tolerance;Decreased knowledge of use of DME;Decreased balance;Decreased mobility       PT Treatment Interventions DME instruction;Therapeutic activities;Gait training;Therapeutic exercise;Patient/family education;Stair training;Balance training;Functional mobility training    PT Goals (Current goals can be found in the Care Plan section)  Acute Rehab PT Goals Patient Stated Goal: none stated  PT Goal Formulation: With patient Time For Goal Achievement: 09/02/18 Potential to Achieve Goals: Good    Frequency 7X/week   Barriers to discharge        Co-evaluation               AM-PAC PT "6 Clicks" Mobility  Outcome Measure Help needed turning from your back to your side while in a flat bed without using bedrails?: A Little Help needed moving from lying on your back to sitting on the side of a flat bed without using bedrails?: A Little Help needed moving to and from a bed to a chair (including a wheelchair)?: A Little Help needed standing up from a chair using your arms (e.g., wheelchair or bedside chair)?: A Little Help needed to walk in hospital room?: A Little Help needed climbing 3-5 steps with a railing? : A Little 6 Click Score: 18    End of Session Equipment Utilized During Treatment: Gait belt Activity Tolerance: Patient tolerated treatment well Patient left: in chair;with chair alarm set;with call bell/phone within reach;with SCD's  reapplied Nurse Communication: Mobility status PT Visit Diagnosis: Other abnormalities of gait and mobility (R26.89);Difficulty in walking, not elsewhere classified (R26.2)    Time: 6433-2951 PT Time Calculation (min) (ACUTE ONLY): 19 min   Charges:   PT Evaluation $PT Eval Low Complexity: 1 Low        Julien Girt, PT Acute Rehabilitation Services Pager [phone]  Office [phone]  Rhys Lichty D Elonda Husky 08/26/2018, 6:50 PM

## 2018-08-26 NOTE — Op Note (Signed)
NAME: Renee Haynes, Renee Haynes MEDICAL RECORD CW:[id number] ACCOUNT [id number] DATE OF BIRTH:[birth year] FACILITY: [facility] LOCATION: [facility] PHYSICIAN:Philana Younis Kerry Fort, MD  OPERATIVE REPORT  DATE OF PROCEDURE:  08/26/2018  PREOPERATIVE DIAGNOSES:  Primary osteoarthritis and degenerative joint disease, left knee, with varus malalignment.  POSTOPERATIVE DIAGNOSES:  Primary osteoarthritis and degenerative joint disease, left knee, with varus malalignment.  PROCEDURE:  Left total knee arthroplasty.  IMPLANTS:  Stryker Triathlon cemented knee system with size 3 femur, size 2 tibial tray, 11 mm fixed-bearing polyethylene insert, size 29 cemented patellar button.  SURGEON:  Lind Guest. Ninfa Linden, MD  ASSISTANT:  Erskine Emery, PA-C  ANESTHESIA: 1.  Left lower extremity adductor canal block. 2.  Spinal.  ANTIBIOTICS:  900 mg IV clindamycin.  TOURNIQUET TIME:  Under 1 hour.  ESTIMATED BLOOD LOSS:  Less  than 200 mL.  COMPLICATIONS:  None.  INDICATIONS:  The patient is a 68 year old female with a history of bilateral knee severe osteoarthritis and degenerative joint disease.  She remotely has a right total knee arthroplasty that has done well for her.  That was many years ago.  Her left  knee pain has become debilitating.  Her x-rays show significant varus malalignment of the knee with complete loss of the medial joint space.  There are periarticular osteophytes in all 3 compartments.  At this point, her pain has been daily and has  detrimentally affected her mobility, her quality of life, and her activities of daily living to the point she does wish to proceed with a total knee arthroplasty on the left side.  We had a long and thorough discussion about the surgery.  We talked about  the risk of acute blood loss anemia, nerve or vessel injury, fracture, infection, DVT, and implant failure.  We talked about our goals being decreased pain, improved mobility, and overall improved quality of  life.  DESCRIPTION OF PROCEDURE:  After informed consent was obtained and appropriate left knee was marked, she was brought to the operating room after an adductor canal block of her left lower extremity was obtained in the holding room.  In the operating room,  she was sat up on the OR table and spinal anesthesia was then obtained.  She was then laid in a supine position.  A Foley catheter was placed, and then a nonsterile tourniquet was placed around her upper left thigh and her left thigh, knee, leg, ankle,  foot were prepped and draped with DuraPrep and sterile drapes.  A time-out was called.  She was identified as correct patient, correct left knee.  We then used an Esmarch to wrap that leg, and tourniquet was inflated to 300 mm of pressure.  We then made  a midline incision over the patella and carried this proximally and distally.  We dissected down to the knee joint and carried out a medial parapatellar arthrotomy, finding a very large joint effusion and significant arthritis throughout her left knee.   With the knee in a flexed position, we removed remnants of ACL, PCL, medial and lateral meniscus and osteophytes around the knee.  We set her extramedullary cutting guide for making our proximal tibia cut, correcting for varus and valgus and a neutral  slope.  We made this cut without difficulty, taking 9 mm off the high side.  We then used an intramedullary drill through the notch area for making our distal femoral cut.  We set this for a left knee at 5 degrees externally rotated and a 10 mm distal  femoral cut.  We made this cut without difficulty and brought the knee back down to full extension.  With our 9 and then 10 mm extension blocks, we had achieved full extension.  We then went back to the femur and put our femoral sizing guide based off  the epicondylar axis, choosing a size 3 femur.  We put our 4-in-1 cutting block for a size 3 femur, made our anterior and posterior cuts followed by our  chamfer cuts.  We then made our femoral box cut for a size 3 femur.  Attention was then turned back  to the tibia.  We did choose a size 2 tibial tray for coverage, setting the rotation off the tibial tubercle and the femur.  We made our keel punch off of this.  With the trial size 2 tibia and the size 3 femur, we trialed a 9 and then 11 mm  fixed-bearing polyethylene insert.  I was pleased with the stability and the range of motion of the 11 insert.  We then made our patellar cut and drilled 3 holes for a size 29 patellar button.  We then removed all instrumentation from the knee and  irrigated the knee with normal saline solution using pulsatile lavage.  We then mixed our cement and cemented the real St[employer]ker Triathlon tibial tray size 2 followed by the real size 3 left femur.  We cemented our patellar button and placed our fixed  bearing size 11 mm polyethylene insert.  We then let the tourniquet down once the cement hardened.  Hemostasis was obtained with electrocautery.  We removed cement debris from the knee and irrigated the knee again with normal saline solution.  We then  closed the arthrotomy with interrupted #1 Vicryl suture followed by 0 Vicryl in the deep tissue, 2-0 Vicryl subcutaneous tissue, 4-0 Monocryl subcuticular stitch and Steri-Strips on the skin.  A well-padded sterile dressing was applied.  She was taken to  recovery room in stable condition.  All final counts were correct.  There were no complications noted.  Note Benita Stabile, PA-C, assisted the entire case.  Assistance was crucial for facilitating all aspects of this case.  LN/NUANCE  D:08/26/2018 T:08/26/2018 JOB:[id number]

## 2018-08-26 NOTE — Brief Op Note (Signed)
08/26/2018  1:00 PM  PATIENT:  [name]  68 y.o. female  PRE-OPERATIVE DIAGNOSIS:  osteoarthritis left knee  POST-OPERATIVE DIAGNOSIS:  osteoarthritis left knee  PROCEDURE:  Procedure(s): LEFT TOTAL KNEE ARTHROPLASTY (Left)  SURGEON:  Surgeon(s) and Role:    Mcarthur Rossetti, MD - Primary  PHYSICIAN ASSISTANT: Benita Stabile, PA-C  ANESTHESIA:   regional and spinal  EBL:  125 mL   COUNTS:  YES  TOURNIQUET:   Total Tourniquet Time Documented: Thigh (Left) - 47 minutes Total: Thigh (Left) - 47 minutes   DICTATION: .Other Dictation: Dictation Number [id number]  PLAN OF CARE: Admit to inpatient   PATIENT DISPOSITION:  PACU - hemodynamically stable.   Delay start of Pharmacological VTE agent (>24hrs) due to surgical blood loss or risk of bleeding: no

## 2018-08-26 NOTE — Progress Notes (Signed)
Assisted Dr. Ellender with left, ultrasound guided, adductor canal block. Side rails up, monitors on throughout procedure. See vital signs in flow sheet. Tolerated Procedure well.  

## 2018-08-26 NOTE — Anesthesia Preprocedure Evaluation (Addendum)
Anesthesia Evaluation  Patient identified by MRN, date of birth, ID band Patient awake    Reviewed: Allergy & Precautions, NPO status , Patient's Chart, lab work & pertinent test results  Airway Mallampati: II  TM Distance: >3 FB Neck ROM: Full    Dental no notable dental hx.    Pulmonary neg pulmonary ROS,    Pulmonary exam normal breath sounds clear to auscultation       Cardiovascular Normal cardiovascular exam Rhythm:Regular Rate:Normal  Right bundle blanch block   Neuro/Psych negative neurological ROS  negative psych ROS   GI/Hepatic Neg liver ROS, GERD  Medicated and Controlled,  Endo/Other  Hypothyroidism   Renal/GU negative Renal ROS     Musculoskeletal  (+) Arthritis ,   Abdominal   Peds  Hematology negative hematology ROS (+)   Anesthesia Other Findings Osteoarthritis left knee  Reproductive/Obstetrics                           Anesthesia Physical Anesthesia Plan  ASA: II  Anesthesia Plan: Spinal and Regional   Post-op Pain Management:  Regional for Post-op pain   Induction: Intravenous  PONV Risk Score and Plan: 2 and Propofol infusion, Dexamethasone, Ondansetron, Midazolam and Treatment may vary due to age or medical condition  Airway Management Planned: Nasal Cannula  Additional Equipment:   Intra-op Plan:   Post-operative Plan:   Informed Consent: I have reviewed the patients History and Physical, chart, labs and discussed the procedure including the risks, benefits and alternatives for the proposed anesthesia with the patient or authorized representative who has indicated his/her understanding and acceptance.     Dental advisory given  Plan Discussed with: CRNA  Anesthesia Plan Comments:         Anesthesia Quick Evaluation

## 2018-08-27 DIAGNOSIS — M1712 Unilateral primary osteoarthritis, left knee: Secondary | ICD-10-CM | POA: Diagnosis present

## 2018-08-27 DIAGNOSIS — Z882 Allergy status to sulfonamides status: Secondary | ICD-10-CM | POA: Diagnosis not present

## 2018-08-27 DIAGNOSIS — E039 Hypothyroidism, unspecified: Secondary | ICD-10-CM | POA: Diagnosis present

## 2018-08-27 DIAGNOSIS — Z96651 Presence of right artificial knee joint: Secondary | ICD-10-CM | POA: Diagnosis present

## 2018-08-27 DIAGNOSIS — Z880 Allergy status to penicillin: Secondary | ICD-10-CM | POA: Diagnosis not present

## 2018-08-27 DIAGNOSIS — Z85828 Personal history of other malignant neoplasm of skin: Secondary | ICD-10-CM | POA: Diagnosis not present

## 2018-08-27 DIAGNOSIS — K219 Gastro-esophageal reflux disease without esophagitis: Secondary | ICD-10-CM | POA: Diagnosis present

## 2018-08-27 LAB — CBC
HCT: 38.4 % (ref 36.0–46.0)
Hemoglobin: 12.5 g/dL (ref 12.0–15.0)
MCH: 31 pg (ref 26.0–34.0)
MCHC: 32.6 g/dL (ref 30.0–36.0)
MCV: 95.3 fL (ref 80.0–100.0)
Platelets: 150 10*3/uL (ref 150–400)
RBC: 4.03 MIL/uL (ref 3.87–5.11)
RDW: 12.4 % (ref 11.5–15.5)
WBC: 7 10*3/uL (ref 4.0–10.5)
nRBC: 0 % (ref 0.0–0.2)

## 2018-08-27 LAB — BASIC METABOLIC PANEL
Anion gap: 9 (ref 5–15)
BUN: 13 mg/dL (ref 8–23)
CHLORIDE: 107 mmol/L (ref 98–111)
CO2: 23 mmol/L (ref 22–32)
Calcium: 8.8 mg/dL — ABNORMAL LOW (ref 8.9–10.3)
Creatinine, Ser: 0.71 mg/dL (ref 0.44–1.00)
GFR calc Af Amer: >60 mL/min (ref >60)
GFR calc non Af Amer: >60 mL/min (ref >60)
Glucose, Bld: 114 mg/dL — ABNORMAL HIGH (ref 70–99)
Potassium: 4 mmol/L (ref 3.5–5.1)
Sodium: 139 mmol/L (ref 135–145)

## 2018-08-27 NOTE — Plan of Care (Signed)
Pt remains stable overall. Pt remains weak after low bp episode with physical therapy. Dr. Ninfa Linden notified of bp drop with Physical therapy. Rn encouraging pt to rest and relax in chair for now with feet elevated and head laid back.

## 2018-08-27 NOTE — Progress Notes (Signed)
Physical Therapy Treatment Patient Details Name: Renee Haynes MRN: [id number] DOB: 06-12-1951 Today's Date: 08/27/2018    History of Present Illness 68 yo female s/p L TKR on 08/26/18. PMH includes DOE, cancer, GERD, OA, R TKR 2009, posterior rectocele repair.     PT Comments    Pt assisted with ambulating however became dizzy, clammy, and with increased perspirations so assisted safely back to recliner.  BP obtained in docflowsheets, RN into room end of session.  Follow Up Recommendations  Follow surgeon's recommendation for DC plan and follow-up therapies;Supervision for mobility/OOB     Equipment Recommendations  Rolling walker with 5" wheels    Recommendations for Other Services       Precautions / Restrictions Precautions Precautions: Fall;Knee Required Braces or Orthoses: Knee Immobilizer - Left Restrictions Weight Bearing Restrictions: No Other Position/Activity Restrictions: WBAT     Mobility  Bed Mobility Overal bed mobility: Needs Assistance Bed Mobility: Supine to Sit     Supine to sit: Min guard;HOB elevated     General bed mobility comments: verbal cues for technique  Transfers Overall transfer level: Needs assistance Equipment used: Rolling walker (2 wheeled) Transfers: Sit to/from Stand Sit to Stand: Min guard;Min assist         General transfer comment: verbal cues for UE and LE positioning, assist upon return to recliner due to dizziness/clammy  Ambulation/Gait Ambulation/Gait assistance: Min guard;Min assist Gait Distance (Feet): 60 Feet Assistive device: Rolling walker (2 wheeled) Gait Pattern/deviations: Step-to pattern;Decreased stride length;Decreased stance time - left     General Gait Details: min/guard until pt became dizzy and with increased perspirations upon returning to room, pt assisted to recliner and BP was low ( RN into room, BP in docflowsheets)   Stairs             Wheelchair Mobility    Modified Rankin  (Stroke Patients Only)       Balance                                            Cognition Arousal/Alertness: Awake/alert Behavior During Therapy: WFL for tasks assessed/performed Overall Cognitive Status: Within Functional Limits for tasks assessed                                        Exercises      General Comments        Pertinent Vitals/Pain Pain Assessment: 0-10 Pain Score: 3  Pain Location: L knee  Pain Descriptors / Indicators: Sore Pain Intervention(s): Monitored during session;Repositioned;Limited activity within patient's tolerance    Home Living                      Prior Function            PT Goals (current goals can now be found in the care plan section) Progress towards PT goals: Progressing toward goals    Frequency    7X/week      PT Plan Current plan remains appropriate    Co-evaluation              AM-PAC PT "6 Clicks" Mobility   Outcome Measure  Help needed turning from your back to your side while in a flat bed without using bedrails?: A Little Help needed  moving from lying on your back to sitting on the side of a flat bed without using bedrails?: A Little Help needed moving to and from a bed to a chair (including a wheelchair)?: A Little Help needed standing up from a chair using your arms (e.g., wheelchair or bedside chair)?: A Little Help needed to walk in hospital room?: A Little Help needed climbing 3-5 steps with a railing? : A Little 6 Click Score: 18    End of Session Equipment Utilized During Treatment: Gait belt;Left knee immobilizer Activity Tolerance: Patient tolerated treatment well;Treatment limited secondary to medical complications (Comment) Patient left: in chair;with call bell/phone within reach Nurse Communication: Mobility status PT Visit Diagnosis: Other abnormalities of gait and mobility (R26.89);Difficulty in walking, not elsewhere classified (R26.2)      Time: 0768-0881 PT Time Calculation (min) (ACUTE ONLY): 18 min  Charges:  $Gait Training: 8-22 mins                     Carmelia Bake, PT, DPT Acute Rehabilitation Services Office: [phone] Pager: [phone]  Trena Platt 08/27/2018, 11:07 AM

## 2018-08-27 NOTE — Progress Notes (Signed)
Pt got weak and dizzy (cool and clammy) with physical therapy on return to chair from walking in hall. Pt stated she felt weak overall. On return to chair pt stated she felt better and her bp rose to 97/60. Pt color and overall condition also improved. Will continue to monitor.

## 2018-08-27 NOTE — Progress Notes (Signed)
Physical Therapy Treatment Patient Details Name: Renee Haynes MRN: [id number] DOB: 27-Apr-1951 Today's Date: 08/27/2018    History of Present Illness 68 yo female s/p L TKR on 08/26/18. PMH includes DOE, cancer, GERD, OA, R TKR 2009, posterior rectocele repair.     PT Comments    Pt performed LE exercises in bed and then ambulated around room.  Pt reports increased nausea at this time however improved slightly since taking meds.  Pt states she has not had pain meds since this morning so she is having more pain this afternoon but very agreeable to perform exercises and mobilize as tolerated.  Pt reports plan for d/c to SNF due to living alone (daughter can assist but not available until weekends).    Follow Up Recommendations  Follow surgeon's recommendation for DC plan and follow-up therapies;Supervision for mobility/OOB(pt reports plan for SNF)     Equipment Recommendations  Rolling walker with 5" wheels    Recommendations for Other Services       Precautions / Restrictions Precautions Precautions: Fall;Knee Required Braces or Orthoses: Knee Immobilizer - Left Restrictions Other Position/Activity Restrictions: WBAT     Mobility  Bed Mobility Overal bed mobility: Needs Assistance Bed Mobility: Supine to Sit;Sit to Supine     Supine to sit: Min guard;HOB elevated Sit to supine: Min guard   General bed mobility comments: verbal cues for technique  Transfers Overall transfer level: Needs assistance Equipment used: Rolling walker (2 wheeled) Transfers: Sit to/from Stand Sit to Stand: Min guard         General transfer comment: verbal cues for UE and LE positioning  Ambulation/Gait Ambulation/Gait assistance: Min guard Gait Distance (Feet): 26 Feet Assistive device: Rolling walker (2 wheeled) Gait Pattern/deviations: Step-to pattern;Decreased stride length;Decreased stance time - left     General Gait Details: min/guard for safety, remained in room due to nausea  currently however no dizziness with ambulating this afternoon   Stairs             Wheelchair Mobility    Modified Rankin (Stroke Patients Only)       Balance                                            Cognition Arousal/Alertness: Awake/alert Behavior During Therapy: WFL for tasks assessed/performed Overall Cognitive Status: Within Functional Limits for tasks assessed                                        Exercises Total Joint Exercises Ankle Circles/Pumps: AROM;Both;10 reps Quad Sets: AROM;10 reps;Both Short Arc Quad: AAROM;10 reps;Left Heel Slides: AAROM;10 reps;Left Hip ABduction/ADduction: AAROM;10 reps;Left Straight Leg Raises: AAROM;10 reps;Left Goniometric ROM: L knee flexion AAROM approx 60* limited by pain    General Comments        Pertinent Vitals/Pain Pain Assessment: 0-10 Pain Score: 6  Pain Location: L knee  Pain Descriptors / Indicators: Sore;Aching Pain Intervention(s): Monitored during session;Repositioned;Limited activity within patient's tolerance;Ice applied    Home Living                      Prior Function            PT Goals (current goals can now be found in the care plan section) Progress towards PT  goals: Progressing toward goals    Frequency    7X/week      PT Plan Current plan remains appropriate    Co-evaluation              AM-PAC PT "6 Clicks" Mobility   Outcome Measure  Help needed turning from your back to your side while in a flat bed without using bedrails?: A Little Help needed moving from lying on your back to sitting on the side of a flat bed without using bedrails?: A Little Help needed moving to and from a bed to a chair (including a wheelchair)?: A Little Help needed standing up from a chair using your arms (e.g., wheelchair or bedside chair)?: A Little Help needed to walk in hospital room?: A Little Help needed climbing 3-5 steps with a railing? :  A Little 6 Click Score: 18    End of Session Equipment Utilized During Treatment: Gait belt;Left knee immobilizer Activity Tolerance: Patient tolerated treatment well Patient left: in bed;with call bell/phone within reach Nurse Communication: Mobility status PT Visit Diagnosis: Other abnormalities of gait and mobility (R26.89);Difficulty in walking, not elsewhere classified (R26.2)     Time: 1607-3710 PT Time Calculation (min) (ACUTE ONLY): 18 min  Charges:   $Therapeutic Exercise: 8-22 mins                     Carmelia Bake, PT, DPT Acute Rehabilitation Services Office: [phone] Pager: [phone]  Trena Platt 08/27/2018, 1:52 PM

## 2018-08-27 NOTE — Progress Notes (Signed)
Patient ID: Renee Haynes, female   DOB: 1951-05-23, 68 y.o.   MRN: [id number] Will need to change to inpatient admission.  She lives alone and will need short-term skilled nursing for further reba following her hospital stay.  Her H&H is stable, but was orthostatic when up.

## 2018-08-27 NOTE — Progress Notes (Signed)
Subjective: Patient stable.  Pain reasonably well controlled.  Tolerating CPM.   Objective: Vital signs in last 24 hours: Temp:  [97.5 F (36.4 C)-98.4 F (36.9 C)] 98.1 F (36.7 C) (01/25 0513) Pulse Rate:  [60-93] 60 (01/25 0513) Resp:  [11-21] 16 (01/25 0513) BP: (87-131)/(50-91) 92/50 (01/25 0513) SpO2:  [91 %-100 %] 93 % (01/25 0513) Weight:  [77.1 kg] 77.1 kg (01/24 0926)  Intake/Output from previous day: 01/24 0701 - 01/25 0700 In: 3294.9 [P.O.:480; I.V.:2814.9] Out: 2000 [Urine:1875; Blood:125] Intake/Output this shift: Total I/O In: 240 [P.O.:240] Out: -   Exam:  Sensation intact distally Dorsiflexion/Plantar flexion intact  Labs: Recent Labs    08/27/18 0328  HGB 12.5   Recent Labs    08/27/18 0328  WBC 7.0  RBC 4.03  HCT 38.4  PLT 150   Recent Labs    08/27/18 0328  NA 139  K 4.0  CL 107  CO2 23  BUN 13  CREATININE 0.71  GLUCOSE 114*  CALCIUM 8.8*   No results for input(s): LABPT, INR in the last 72 hours.  Assessment/Plan: Plan at this time is to continue to mobilize with therapy.  Anticipate discharge Sunday versus Monday.   G Scott Dean 08/27/2018, 9:02 AM

## 2018-08-27 NOTE — NC FL2 (Signed)
[place] LEVEL OF CARE SCREENING TOOL     IDENTIFICATION  Patient Name: Renee Haynes Birthdate: October 25, 1950 Sex: female Admission Date (Current Location): 08/26/2018  [facility] and [place] Number:  Herbalist and Address:  [address],  [place], [place]      Provider Number: [id number]  Attending Physician Name and Address:  Mcarthur Rossetti  Relative Name and Phone Number:  Lance Bosch: [phone]    Current Level of Care: Hospital Recommended Level of Care: [place] Prior Approval Number:    Date Approved/Denied:   PASRR Number: [id number]  Discharge Plan: SNF    Current Diagnoses: Patient Active Problem List   Diagnosis Date Noted  . Status post total left knee replacement 08/26/2018  . Unilateral primary osteoarthritis, left knee 08/15/2018  . Sprain of unspecified ligament of right ankle, initial encounter 06/29/2016  . Rectocele 06/10/2011  . Abnormal ECG 03/26/2011  . Dyspnea on exertion 03/26/2011    Orientation RESPIRATION BLADDER Height & Weight     Self, Situation, Place, Time  Normal Continent Weight: 170 lb (77.1 kg) Height:  5' 5.5" (166.4 cm)  BEHAVIORAL SYMPTOMS/MOOD NEUROLOGICAL BOWEL NUTRITION STATUS      Continent Diet(Regular)  AMBULATORY STATUS COMMUNICATION OF NEEDS Skin   Limited Assist Verbally Surgical wounds(L knee incision)                       Personal Care Assistance Level of Assistance  Dressing, Bathing, Feeding Bathing Assistance: Limited assistance Feeding assistance: Independent Dressing Assistance: Limited assistance     Functional Limitations Info  Sight, Hearing, Speech Sight Info: Adequate Hearing Info: Adequate Speech Info: Adequate    SPECIAL CARE FACTORS FREQUENCY  PT (By licensed PT), OT (By licensed OT)     PT Frequency: 5x/week OT Frequency: 5x/week            Contractures Contractures Info: Not present     Additional Factors Info  Code Status, Allergies Code Status Info: Full Allergies Info: PENICILLINS CROSS REACTORS, SULFA DRUGS CROSS REACTORS            Current Medications (08/27/2018):  This is the current hospital active medication list Current Facility-Administered Medications  Medication Dose Route Frequency Provider Last Rate Last Dose  . 0.9 %  sodium chloride infusion   Intravenous Continuous Mcarthur Rossetti, MD 75 mL/hr at 08/27/18 1425    . acetaminophen (TYLENOL) tablet 325-650 mg  325-650 mg Oral Q6H PRN Mcarthur Rossetti, MD      . alum & mag hydroxide-simeth (MAALOX/MYLANTA) 200-200-20 MG/5ML suspension 30 mL  30 mL Oral Q4H PRN Mcarthur Rossetti, MD      . aspirin EC tablet 325 mg  325 mg Oral BID PC Mcarthur Rossetti, MD   325 mg at 08/27/18 0834  . diphenhydrAMINE (BENADRYL) 12.5 MG/5ML elixir 12.5-25 mg  12.5-25 mg Oral Q4H PRN Mcarthur Rossetti, MD      . docusate sodium (COLACE) capsule 100 mg  100 mg Oral BID Mcarthur Rossetti, MD   100 mg at 08/27/18 0835  . HYDROmorphone (DILAUDID) injection 0.5-1 mg  0.5-1 mg Intravenous Q2H PRN Mcarthur Rossetti, MD      . levothyroxine (SYNTHROID, LEVOTHROID) tablet 88 mcg  88 mcg Oral QAC breakfast Mcarthur Rossetti, MD   88 mcg at 08/27/18 0517  . menthol-cetylpyridinium (CEPACOL) lozenge 3 mg  1 lozenge Oral PRN Mcarthur Rossetti, MD  Or  . phenol (CHLORASEPTIC) mouth spray 1 spray  1 spray Mouth/Throat PRN Mcarthur Rossetti, MD      . methocarbamol (ROBAXIN) tablet 500 mg  500 mg Oral Q6H PRN Mcarthur Rossetti, MD   500 mg at 08/27/18 1413   Or  . methocarbamol (ROBAXIN) 500 mg in dextrose 5 % 50 mL IVPB  500 mg Intravenous Q6H PRN Mcarthur Rossetti, MD      . metoCLOPramide (REGLAN) tablet 5-10 mg  5-10 mg Oral Q8H PRN Mcarthur Rossetti, MD       Or  . metoCLOPramide (REGLAN) injection 5-10 mg  5-10 mg Intravenous Q8H PRN Mcarthur Rossetti, MD      . ondansetron [facility]) tablet 4 mg  4 mg Oral Q6H PRN Mcarthur Rossetti, MD   4 mg at 08/27/18 0836   Or  . ondansetron [facility]) injection 4 mg  4 mg Intravenous Q6H PRN Mcarthur Rossetti, MD   4 mg at 08/27/18 1253  . oxyCODONE (Oxy IR/ROXICODONE) immediate release tablet 10-15 mg  10-15 mg Oral Q4H PRN Mcarthur Rossetti, MD   15 mg at 08/27/18 0835  . oxyCODONE (Oxy IR/ROXICODONE) immediate release tablet 5-10 mg  5-10 mg Oral Q3H PRN Mcarthur Rossetti, MD   5 mg at 08/27/18 1413  . pantoprazole (PROTONIX) EC tablet 40 mg  40 mg Oral Daily Mcarthur Rossetti, MD   40 mg at 08/27/18 0835  . polyethylene glycol (MIRALAX / GLYCOLAX) packet 17 g  17 g Oral Daily PRN Mcarthur Rossetti, MD      . zolpidem [facility]) tablet 5 mg  5 mg Oral QHS PRN Mcarthur Rossetti, MD         Discharge Medications: Please see discharge summary for a list of discharge medications.  Relevant Imaging Results:  Relevant Lab Results:   Additional Information SSN: [id number]  Pricilla Holm, [place]

## 2018-08-27 NOTE — Progress Notes (Signed)
Dr. Ninfa Linden notified of pt poor urinary output after foley removal. He stated we could in and out her as needed for urinary retention.

## 2018-08-27 NOTE — Progress Notes (Signed)
OT Cancellation Note  Patient Details Name: Renee Haynes MRN: [id number] DOB: 09/10/1950   Cancelled Treatment:    Reason Eval/Treat Not Completed: Other (comment)  Pt feeling weak after session with PT - will check back on pt later in day or in the morning Kari Baars, [place] [phone] Office- [phone], Edwena Felty D 08/27/2018, 12:32 PM

## 2018-08-27 NOTE — Clinical Social Work Note (Signed)
Clinical Social Work Assessment  Patient Details  Name: Renee Haynes MRN: [id number] Date of Birth: February 20, 1951  Date of referral:  08/27/18               Reason for consult:  Facility Placement                Permission sought to share information with:  Facility Sport and exercise psychologist Permission granted to share information::     Name::     Renee Haynes  Agency::  SNF  Relationship::  Son  Contact Information:  [phone]  Housing/Transportation Living arrangements for the past 2 months:  Single Family Home Source of Information:  Patient Patient Interpreter Needed:  None Criminal Activity/Legal Involvement Pertinent to Current Situation/Hospitalization:  No - Comment as needed Significant Relationships:  Adult Children Lives with:  Self Do you feel safe going back to the place where you live?  Yes Need for family participation in patient care:  No (Coment)  Care giving concerns:  Patient s/p L TKR with recommendation for SNF to regain strength before returning home. Patient lives alone and will not have support until late next week.   Social Worker assessment / plan:  CSW met with patient at bedside to discuss discharge plan and SNF process. Patient reports she lives alone and feels SNF level therapy will be safest option for her. She has not previously been to SNF. Patient has supportive children - a son who lives in [place] and daughter who lives in [place].   Patient has family member who is EMT who was able to provide SNF recommendations to patient. [facility] and [facility] are patient's preferences.  CSW will complete FL2 and send out referrals. Explained process to patient who reports understanding. She gave permission to fax information to other area facilities in case preferences cannot accept her.  HTA insurance Josem Kaufmann will be required prior to discharge.  Employment status:  Retired Forensic scientist:  Managed Care PT Recommendations:  [place] / Referral to community resources:  [place]  Patient/Family's Response to care:  Patient is appreciative and understanding of CSW's role and explanation of process.  Patient/Family's Understanding of and Emotional Response to Diagnosis, Current Treatment, and Prognosis:  Patient understands discharge plan and knows CSW will f/u with bed offers.  Emotional Assessment Appearance:  Appears stated age Attitude/Demeanor/Rapport:  Engaged Affect (typically observed):  Appropriate, Pleasant Orientation:  Oriented to Self, Oriented to Place, Oriented to  Time, Oriented to Situation Alcohol / Substance use:  Not Applicable Psych involvement (Current and /or in the community):  No (Comment)  Discharge Needs  Concerns to be addressed:  Care Coordination Readmission within the last 30 days:  No Current discharge risk:  Lives alone, Physical Impairment Barriers to Discharge:  Ship broker, Continued Medical Work up   [employer], [place] 08/27/2018, 2:04 PM

## 2018-08-28 ENCOUNTER — Encounter (HOSPITAL_COMMUNITY): Payer: Self-pay | Admitting: Orthopaedic Surgery

## 2018-08-28 NOTE — Evaluation (Signed)
Occupational Therapy Evaluation Patient Details Name: Renee Haynes MRN: [id number] DOB: Oct 18, 1950 Today's Date: 08/28/2018    History of Present Illness 68 yo female s/p L TKR on 08/26/18. PMH includes DOE, cancer, GERD, OA, R TKR 2009, posterior rectocele repair.    Clinical Impression   PATIENT WAS EDUCATED ON INCREASING I AND SAFETY WITH ADLS WITH USE OF AE. PATIENT WAS ABLE TO PERFORM LE DRESSING AT MOD/MAX A LEVEL. PATIENT WILL NEED FURTHER EDUCATION. PATIENT DOES NOT WANT TO GO TO REHAB AND WANTS TO Renee Haynes. PATIENT WILL NEED FURTHER TRAINING ON ADL TRANSFERS WITH USE OF DME. PATIENT IS MOTIVATED TO WORK WITH OT. PATIENT WOULD BENEFIT FROM HHOT AT D/C TO ASSESS SAFETY AND I AT [facility].    Follow Up Recommendations  [facility] OT    Equipment Recommendations  None recommended by OT    Recommendations for Other Services       Precautions / Restrictions Precautions Precautions: Fall;Knee Required Braces or Orthoses: Knee Immobilizer - Left Restrictions Weight Bearing Restrictions: No Other Position/Activity Restrictions: WBAT       Mobility Bed Mobility         Supine to sit: Supervision        Transfers   Equipment used: Rolling walker (2 wheeled) Transfers: Stand Pivot Transfers Sit to Stand: Supervision Stand pivot transfers: Supervision            Balance                                           ADL either performed or assessed with clinical judgement   ADL Overall ADL's : Needs assistance/impaired Eating/Feeding: Independent   Grooming: Wash/dry hands;Wash/dry face;Supervision/safety;Set up   Upper Body Bathing: Set up;Sitting   Lower Body Bathing: Minimal assistance;Moderate assistance;Sit to/from stand   Upper Body Dressing : Set up;Sitting   Lower Body Dressing: Moderate assistance;Maximal assistance;With adaptive equipment;Sit to/from stand   Toilet Transfer: Supervision/safety;Stand-pivot   Toileting-  Water quality scientist and Hygiene: Supervision/safety       Functional mobility during ADLs: Supervision/safety;Rolling walker General ADL Comments: PATIEN WILL NEED FURTHER TRAINING FOR ADSL WITH USE OF AE.      Vision Baseline Vision/History: Wears glasses Wears Glasses: Reading only Patient Visual Report: No change from baseline       Perception     Praxis      Pertinent Vitals/Pain Pain Assessment: 0-10 Pain Score: 2  Pain Location: L knee  Pain Descriptors / Indicators: Sore;Aching Pain Intervention(s): Monitored during session;Premedicated before session     Hand Dominance Left   Extremity/Trunk Assessment Upper Extremity Assessment Upper Extremity Assessment: Overall WFL for tasks assessed           Communication Communication Communication: No difficulties   Cognition Arousal/Alertness: Awake/alert Behavior During Therapy: WFL for tasks assessed/performed Overall Cognitive Status: Within Functional Limits for tasks assessed                                     General Comments       Exercises     Shoulder Instructions      [facility] Living Family/patient expects to be discharged to:: Private residence Living Arrangements: Alone Available Help at Discharge: Family;Available PRN/intermittently Type of Haynes: House Haynes Access: Stairs to enter Entrance Stairs-Number of Steps: 3 Entrance  Stairs-Rails: None Haynes Layout: Multi-level Alternate Level Stairs-Number of Steps: 3 steps to kitchen, 3 steps to bedroom/bathroom  Alternate Level Stairs-Rails: None Bathroom Shower/Tub: Occupational psychologist: Handicapped height     Haynes Equipment: Shower seat - built in   Additional Comments: [name] RAILS.       Prior Functioning/Environment Level of Independence: Independent                 OT Problem List: Decreased activity tolerance;Decreased knowledge of use of DME or AE      OT  Treatment/Interventions:      OT Goals(Current goals can be found in the care plan section) Acute Rehab OT Goals Patient Stated Goal: GO Haynes OT Goal Formulation: With patient Time For Goal Achievement: 09/04/18 Potential to Achieve Goals: Good ADL Goals Pt Will Perform Lower Body Bathing: with modified independence Pt Will Perform Lower Body Dressing: with modified independence Pt Will Transfer to Toilet: with modified independence Pt Will Perform Toileting - Clothing Manipulation and hygiene: with modified independence Pt Will Perform Tub/Shower Transfer: Shower transfer;ambulating;with supervision  OT Frequency: Min 2X/week   Barriers to D/C: Decreased caregiver support          Co-evaluation              AM-PAC OT "6 Clicks" Daily Activity     Outcome Measure Help from another person eating meals?: None Help from another person taking care of personal grooming?: A Little Help from another person toileting, which includes using toliet, bedpan, or urinal?: A Little Help from another person bathing (including washing, rinsing, drying)?: A Little Help from another person to put on and taking off regular upper body clothing?: A Little Help from another person to put on and taking off regular lower body clothing?: A Lot 6 Click Score: 18   End of Session Equipment Utilized During Treatment: Gait belt;Rolling walker Nurse Communication: (OK THERAPY)  Activity Tolerance: Patient tolerated treatment well Patient left: in chair;with call bell/phone within reach  OT Visit Diagnosis: Unsteadiness on feet (R26.81)                Time: 0600-4599 OT Time Calculation (min): 38 min Charges:  OT General Charges $OT Visit: 1 Visit OT Evaluation $OT Eval Low Complexity: 1 Low OT Treatments $Self Care/Haynes Management : 7-74 mins  6 CLICKS  Hammad Finkler 08/28/2018, 9:33 AM

## 2018-08-28 NOTE — Plan of Care (Signed)
Pt stable this am with no needs. No s/s of distress. Pt did well with physical therapy today, she is improving with each session.

## 2018-08-28 NOTE — Progress Notes (Signed)
Subjective: Patient doing well today.  She has a very positive attitude and is very motivated.  She did have a little dizziness yesterday when she was trying to get up and walk around.   Objective: Vital signs in last 24 hours: Temp:  [97.6 F (36.4 C)-98.4 F (36.9 C)] 98.4 F (36.9 C) (01/26 0451) Pulse Rate:  [58-65] 62 (01/26 0451) Resp:  [14-18] 14 (01/26 0451) BP: (85-110)/(57-62) 110/61 (01/26 0451) SpO2:  [91 %-97 %] 91 % (01/26 0451)  Intake/Output from previous day: 01/25 0701 - 01/26 0700 In: 2662.4 [P.O.:1660; I.V.:1002.4] Out: 2400 [Urine:2400] Intake/Output this shift: Total I/O In: 240 [P.O.:240] Out: 450 [Urine:450]  Exam:  Dorsiflexion/Plantar flexion intact  Labs: Recent Labs    08/27/18 0328  HGB 12.5   Recent Labs    08/27/18 0328  WBC 7.0  RBC 4.03  HCT 38.4  PLT 150   Recent Labs    08/27/18 0328  NA 139  K 4.0  CL 107  CO2 23  BUN 13  CREATININE 0.71  GLUCOSE 114*  CALCIUM 8.8*   No results for input(s): LABPT, INR in the last 72 hours.  Assessment/Plan: Plan at this time is therapy today.  In general her pain is controlled.  She may be a patient who could avoid the need for skilled nursing by staying in extra day on Monday since she does not have anyone with her full-time at home.  We will see how she does tomorrow.   Renee Haynes 08/28/2018, 9:43 AM

## 2018-08-28 NOTE — Progress Notes (Signed)
Physical Therapy Treatment Patient Details Name: Renee Haynes MRN: [id number] DOB: 1951/04/30 Today's Date: 08/28/2018    History of Present Illness 68 yo female s/p L TKR on 08/26/18. PMH includes DOE, cancer, GERD, OA, R TKR 2009, posterior rectocele repair.     PT Comments    Pt ambulated in hallway and assisted back to bed.  Pt's mobility is improving and she anticipates d/c home tomorrow.    Follow Up Recommendations  Follow surgeon's recommendation for DC plan and follow-up therapies;Supervision for mobility/OOB(plans for HHPT)     Equipment Recommendations  Rolling walker with 5" wheels    Recommendations for Other Services       Precautions / Restrictions Precautions Precautions: Fall;Knee Required Braces or Orthoses: Knee Immobilizer - Left Restrictions Other Position/Activity Restrictions: WBAT     Mobility  Bed Mobility Overal bed mobility: Needs Assistance Bed Mobility: Sit to Supine       Sit to supine: Min guard   General bed mobility comments: increased time and effort however no physical assist required  Transfers Overall transfer level: Needs assistance Equipment used: Rolling walker (2 wheeled) Transfers: Sit to/from Stand Sit to Stand: Supervision Stand pivot transfers: Supervision       General transfer comment: verbal cues for UE and LE positioning  Ambulation/Gait Ambulation/Gait assistance: Min guard;Supervision Gait Distance (Feet): 200 Feet Assistive device: Rolling walker (2 wheeled) Gait Pattern/deviations: Decreased stride length;Decreased stance time - left;Step-through pattern     General Gait Details: min/guard for safety, verbal cues for sequencing, RW positioning, step length   Stairs             Wheelchair Mobility    Modified Rankin (Stroke Patients Only)       Balance                                            Cognition Arousal/Alertness: Awake/alert Behavior During Therapy: WFL  for tasks assessed/performed Overall Cognitive Status: Within Functional Limits for tasks assessed                                        Exercises     General Comments        Pertinent Vitals/Pain Pain Assessment: 0-10 Pain Score: 4  Pain Location: L knee  Pain Descriptors / Indicators: Sore;Aching Pain Intervention(s): Monitored during session;Limited activity within patient's tolerance;Repositioned;Ice applied    Home Living                      Prior Function            PT Goals (current goals can now be found in the care plan section) Progress towards PT goals: Progressing toward goals    Frequency    7X/week      PT Plan Current plan remains appropriate    Co-evaluation              AM-PAC PT "6 Clicks" Mobility   Outcome Measure  Help needed turning from your back to your side while in a flat bed without using bedrails?: A Little Help needed moving from lying on your back to sitting on the side of a flat bed without using bedrails?: A Little Help needed moving to and from a bed to a chair (  including a wheelchair)?: A Little Help needed standing up from a chair using your arms (e.g., wheelchair or bedside chair)?: A Little Help needed to walk in hospital room?: A Little Help needed climbing 3-5 steps with a railing? : A Little 6 Click Score: 18    End of Session Equipment Utilized During Treatment: Left knee immobilizer Activity Tolerance: Patient tolerated treatment well Patient left: with call bell/phone within reach;in bed Nurse Communication: Mobility status PT Visit Diagnosis: Other abnormalities of gait and mobility (R26.89);Difficulty in walking, not elsewhere classified (R26.2)     Time: 0447-1580 PT Time Calculation (min) (ACUTE ONLY): 13 min  Charges:  $Gait Training: 8-22 mins                     Carmelia Bake, PT, DPT Acute Rehabilitation Services Office: [phone] Pager:  [phone]  Renee Haynes 08/28/2018, 3:09 PM

## 2018-08-28 NOTE — Progress Notes (Signed)
Physical Therapy Treatment Patient Details Name: Renee Haynes MRN: [id number] DOB: 1950/08/13 Today's Date: 08/28/2018    History of Present Illness 68 yo female s/p L TKR on 08/26/18. PMH includes DOE, cancer, GERD, OA, R TKR 2009, posterior rectocele repair.     PT Comments    Pt ambulated improved distance in hallway and then performed LE exercises.  Pt reports feeling better overall today and d/c plan is now for home since she is able to stay another day.  Follow Up Recommendations  Follow surgeon's recommendation for DC plan and follow-up therapies;Supervision for mobility/OOB(pt now reports plan is for HHPT)     Equipment Recommendations       Recommendations for Other Services       Precautions / Restrictions Precautions Precautions: Fall;Knee Required Braces or Orthoses: Knee Immobilizer - Left Restrictions Other Position/Activity Restrictions: WBAT     Mobility  Bed Mobility               General bed mobility comments: pt up in recliner on arrival  Transfers Overall transfer level: Needs assistance Equipment used: Rolling walker (2 wheeled) Transfers: Sit to/from Stand Sit to Stand: Supervision Stand pivot transfers: Supervision       General transfer comment: verbal cues for UE and LE positioning  Ambulation/Gait Ambulation/Gait assistance: Min guard Gait Distance (Feet): 120 Feet Assistive device: Rolling walker (2 wheeled) Gait Pattern/deviations: Decreased stride length;Decreased stance time - left;Step-through pattern     General Gait Details: min/guard for safety, verbal cues for sequencing, RW positioning, step length   Stairs             Wheelchair Mobility    Modified Rankin (Stroke Patients Only)       Balance                                            Cognition Arousal/Alertness: Awake/alert Behavior During Therapy: WFL for tasks assessed/performed Overall Cognitive Status: Within Functional  Limits for tasks assessed                                        Exercises Total Joint Exercises Ankle Circles/Pumps: AROM;Both;10 reps Quad Sets: AROM;10 reps;Both Short Arc Quad: AAROM;10 reps;Left Heel Slides: AAROM;10 reps;Left Hip ABduction/ADduction: AAROM;10 reps;Left Straight Leg Raises: AAROM;10 reps;Left    General Comments        Pertinent Vitals/Pain Pain Assessment: 0-10 Pain Score: 5  Pain Location: L knee  Pain Descriptors / Indicators: Sore;Aching Pain Intervention(s): Limited activity within patient's tolerance;Monitored during session;Repositioned    Home Living                      Prior Function            PT Goals (current goals can now be found in the care plan section) Progress towards PT goals: Progressing toward goals    Frequency    7X/week      PT Plan Current plan remains appropriate    Co-evaluation              AM-PAC PT "6 Clicks" Mobility   Outcome Measure  Help needed turning from your back to your side while in a flat bed without using bedrails?: A Little Help needed moving from lying on  your back to sitting on the side of a flat bed without using bedrails?: A Little Help needed moving to and from a bed to a chair (including a wheelchair)?: A Little Help needed standing up from a chair using your arms (e.g., wheelchair or bedside chair)?: A Little Help needed to walk in hospital room?: A Little Help needed climbing 3-5 steps with a railing? : A Little 6 Click Score: 18    End of Session Equipment Utilized During Treatment: Gait belt;Left knee immobilizer Activity Tolerance: Patient tolerated treatment well Patient left: with call bell/phone within reach;in chair Nurse Communication: Mobility status PT Visit Diagnosis: Other abnormalities of gait and mobility (R26.89);Difficulty in walking, not elsewhere classified (R26.2)     Time: 3833-3832 PT Time Calculation (min) (ACUTE ONLY): 20  min  Charges:  $Therapeutic Exercise: 8-22 mins                     Carmelia Bake, PT, DPT Acute Rehabilitation Services Office: [phone] Pager: [phone]  Trena Platt 08/28/2018, 12:50 PM

## 2018-08-28 NOTE — Care Management Note (Signed)
Case Management Note  Patient Details  Name: Renee Haynes MRN: [id number] Date of Birth: 07-09-1951  Subjective/Objective:    S/p L TKR                Action/Plan:  NCM spoke to pt and states she lives at home alone and wanted to go to SNF rehab. She had decided to dc home with HHPT. Offered choice for HH/CMS list provided and placed on chart. Pt requested RW. Contacted [facility] for RW and was delivered to room. Pt states she has family and friend that can assist at home.    Expected Discharge Date:                  Expected Discharge Plan:  [place]  In-House Referral:  Clinical Social Work  Discharge planning Services  CM Consult  Post Acute Care Choice:  Home Health Choice offered to:  Patient  DME Arranged:  Walker rolling DME Agency:  [place]:  PT [place]:  [facility]  Status of Service:  Completed, signed off  If discussed at [employer], dates discussed:    Additional Comments:  Erenest Rasher, RN 08/28/2018, 11:38 AM

## 2018-08-29 MED ORDER — ASPIRIN 325 MG PO TBEC: 325.0000 mg | DELAYED_RELEASE_TABLET | Freq: Two times a day (BID) | ORAL | 0 refills | Status: DC

## 2018-08-29 MED ORDER — OXYCODONE HCL 5 MG PO TABS: 5.0000 mg | ORAL_TABLET | ORAL | 0 refills | Status: DC | PRN

## 2018-08-29 MED ORDER — METHOCARBAMOL 500 MG PO TABS: 500.0000 mg | ORAL_TABLET | Freq: Four times a day (QID) | ORAL | 0 refills | Status: DC | PRN

## 2018-08-29 NOTE — Discharge Instructions (Signed)

## 2018-08-29 NOTE — Progress Notes (Signed)
Physical Therapy Treatment Patient Details Name: Renee Haynes MRN: [id number] DOB: 1950/08/20 Today's Date: 08/29/2018    History of Present Illness 68 yo female s/p L TKR on 08/26/18. PMH includes DOE, cancer, GERD, OA, R TKR 2009, posterior rectocele repair.     PT Comments    POD # 3 pm session Assisted with amb a second time, practiced stairs then returned to room and assisted to bathroom.  Addressed all mobility questions, discussed appropriate activity, educated on use of ICE.  Pt ready for D/C to home.   Follow Up Recommendations  Follow surgeon's recommendation for DC plan and follow-up therapies;Supervision for mobility/OOB     Equipment Recommendations  Rolling walker with 5" wheels    Recommendations for Other Services       Precautions / Restrictions Precautions Precautions: Fall;Knee Required Braces or Orthoses: Knee Immobilizer - Left Restrictions Weight Bearing Restrictions: No Other Position/Activity Restrictions: WBAT     Mobility  Bed Mobility Overal bed mobility: Needs Assistance Bed Mobility: Supine to Sit     Supine to sit: Supervision     General bed mobility comments: OOB in recliner   Transfers Overall transfer level: Needs assistance Equipment used: Rolling walker (2 wheeled) Transfers: Sit to/from [facility] Sit to Stand: Supervision Stand pivot transfers: Supervision       General transfer comment: good safety cognition and use of hands to steady self   Ambulation/Gait Ambulation/Gait assistance: Supervision Gait Distance (Feet): 55 Feet Assistive device: Rolling walker (2 wheeled) Gait Pattern/deviations: Decreased stride length;Decreased stance time - left;Step-through pattern Gait velocity: slightly decr    General Gait Details: min/guard for safety, verbal cues for sequencing, RW positioning, step length   Stairs Stairs: Yes Stairs assistance: Min assist Stair Management: No rails;Step to  pattern;Forwards;With walker Number of Stairs: 6 General stair comments: 50% VC's on proper tech andnwalker placement    Wheelchair Mobility    Modified Rankin (Stroke Patients Only)       Balance Overall balance assessment: Mild deficits observed, not formally tested                                          Cognition Arousal/Alertness: Awake/alert Behavior During Therapy: WFL for tasks assessed/performed Overall Cognitive Status: Within Functional Limits for tasks assessed                                        Exercises      General Comments        Pertinent Vitals/Pain Pain Assessment: 0-10 Pain Score: 4  Pain Location: L knee  Pain Descriptors / Indicators: Sore;Aching;Operative site guarding Pain Intervention(s): Monitored during session;Premedicated before session;Ice applied;Repositioned    Home Living                      Prior Function            PT Goals (current goals can now be found in the care plan section) Progress towards PT goals: Progressing toward goals    Frequency    7X/week      PT Plan Current plan remains appropriate    Co-evaluation              AM-PAC PT "6 Clicks" Mobility   Outcome Measure  Help needed  turning from your back to your side while in a flat bed without using bedrails?: A Little Help needed moving from lying on your back to sitting on the side of a flat bed without using bedrails?: A Little Help needed moving to and from a bed to a chair (including a wheelchair)?: A Little Help needed standing up from a chair using your arms (e.g., wheelchair or bedside chair)?: A Little Help needed to walk in hospital room?: A Little Help needed climbing 3-5 steps with a railing? : A Little 6 Click Score: 18    End of Session Equipment Utilized During Treatment: Gait belt Activity Tolerance: Patient tolerated treatment Haynes Patient left: in chair;with call bell/phone within  reach Nurse Communication: Mobility status PT Visit Diagnosis: Other abnormalities of gait and mobility (R26.89);Difficulty in walking, not elsewhere classified (R26.2)     Time: 1245-1310 PT Time Calculation (min) (ACUTE ONLY): 25 min  Charges:  $Gait Training: 8-22 mins  $Self Care/Home Management: [place]  PTA Acute  Rehabilitation Services Pager      [phone] Office      [phone]

## 2018-08-29 NOTE — Progress Notes (Signed)
Patient ID: Renee Haynes, female   DOB: October 06, 1950, 68 y.o.   MRN: [id number] She is feeling better and mobilizing better.  Her son can stay with her, so she would now rather be discharged to home instead of skilled nursing.  Can go today.

## 2018-08-29 NOTE — Consult Note (Signed)
   [facility] CM Inpatient Consult   08/29/2018  Renee Haynes 05-Apr-1951 [id number]   Patient screened for potential [facility] Care Management services.  Went to bedside to discuss [place] Management program. Ms. Mallet denies having any [facility] Care Management needs at this time.  Accepted [facility] Care Management brochure with contact information to call if needed.   Appreciative of visit.    Marthenia Rolling, MSN-Ed, RN,BSN [facility] Liaison [phone]

## 2018-08-29 NOTE — Discharge Summary (Signed)
Patient ID: Renee Haynes MRN: [id number] DOB/AGE: 1951-07-30 68 y.o.  Admit date: 08/26/2018 Discharge date: 08/29/2018  Admission Diagnoses:  Principal Problem:   Unilateral primary osteoarthritis, left knee Active Problems:   Status post total left knee replacement   Discharge Diagnoses:  Same  Past Medical History:  Diagnosis Date  . Abnormal EKG    recent w/u and neg echo  . Cancer ([place])    BCC chest and Squamous cell on chest  . Dysrhythmia    right bundle blanch block  . GERD (gastroesophageal reflux disease)   . History of kidney stones   . Hypothyroidism    hypothyroidism  . Osteoarthritis of right knee   . Pneumonia     Surgeries: Procedure(s): LEFT TOTAL KNEE ARTHROPLASTY on 08/26/2018   Consultants:   Discharged Condition: Improved  Hospital Course: Renee Haynes is an 68 y.o. female who was admitted 08/26/2018 for operative treatment ofUnilateral primary osteoarthritis, left knee. Patient has severe unremitting pain that affects sleep, daily activities, and work/hobbies. After pre-op clearance the patient was taken to the operating room on 08/26/2018 and underwent  Procedure(s): LEFT TOTAL KNEE ARTHROPLASTY.    Patient was given perioperative antibiotics:  Anti-infectives (From admission, onward)   Start     Dose/Rate Route Frequency Ordered Stop   08/26/18 1800  clindamycin (CLEOCIN) IVPB 600 mg     600 mg 100 mL/hr over 30 Minutes Intravenous Every 6 hours 08/26/18 1447 08/27/18 0115   08/26/18 0900  clindamycin (CLEOCIN) IVPB 900 mg     900 mg 100 mL/hr over 30 Minutes Intravenous On call to O.R. 08/26/18 0853 08/26/18 1146       Patient was given sequential compression devices, early ambulation, and chemoprophylaxis to prevent DVT.  Patient benefited maximally from hospital stay and there were no complications.    Recent vital signs:  Patient Vitals for the past 24 hrs:  BP Temp Temp src Pulse Resp SpO2  08/29/18 0457 120/63 98.2 F (36.8  C) Oral 61 16 94 %  08/28/18 2028 (!) 106/59 98.3 F (36.8 C) Oral 61 16 95 %  08/28/18 1405 (!) 104/58 98 F (36.7 C) Oral 69 16 96 %     Recent laboratory studies:  Recent Labs    08/27/18 0328  WBC 7.0  HGB 12.5  HCT 38.4  PLT 150  NA 139  K 4.0  CL 107  CO2 23  BUN 13  CREATININE 0.71  GLUCOSE 114*  CALCIUM 8.8*     Discharge Medications:   Allergies as of 08/29/2018      Reactions   Penicillins Cross Reactors Rash   DID THE REACTION INVOLVE: Swelling of the face/tongue/throat, SOB, or low BP? No Sudden or severe rash/hives, skin peeling, or the inside of the mouth or nose? No Did it require medical treatment? No When did it last happen?25+ years If all above answers are "NO", may proceed with cephalosporin use.   Sulfa Drugs Cross Reactors Rash      Medication List    TAKE these medications   AMBIEN 10 MG tablet Generic drug:  zolpidem Take 5 mg by mouth at bedtime as needed for sleep.   aspirin 325 MG EC tablet Take 1 tablet (325 mg total) by mouth 2 (two) times daily after a meal. What changed:    medication strength  how much to take  when to take this   CALCIUM PO Take 1 tablet by mouth every 14 (fourteen) days.   cholecalciferol  25 MCG (1000 UT) tablet Commonly known as:  VITAMIN D3 Take 1,000 Units by mouth See admin instructions. Take 1000 units by mouth 5 times weekly   levothyroxine 88 MCG tablet Commonly known as:  SYNTHROID, LEVOTHROID Take 88 mcg by mouth daily before breakfast.   methocarbamol 500 MG tablet Commonly known as:  ROBAXIN Take 1 tablet (500 mg total) by mouth every 6 (six) hours as needed for muscle spasms.   MULTIVITAMIN PO Take 1 tablet by mouth 3 (three) times a week.   omeprazole 20 MG tablet Commonly known as:  PRILOSEC OTC Take 20 mg by mouth daily as needed (acid reflux).   oxyCODONE 5 MG immediate release tablet Commonly known as:  Oxy IR/ROXICODONE Take 1-2 tablets (5-10 mg total) by mouth  every 4 (four) hours as needed for moderate pain (pain score 4-6).            Durable Medical Equipment  (From admission, onward)         Start     Ordered   08/27/18 1042  For home use only DME Walker rolling  Once    Question:  Patient needs a walker to treat with the following condition  Answer:  S/P TKR (total knee replacement), left   08/27/18 1042          Diagnostic Studies: Dg Knee Left Port  Result Date: 08/26/2018 CLINICAL DATA:  Status post knee replacement EXAM: PORTABLE LEFT KNEE - 1-2 VIEW COMPARISON:  None. FINDINGS: The patient is status post left knee replacement. Hardware is in good position. IMPRESSION: Left knee replacement as above. Electronically Signed   By: Dorise Bullion III M.D   On: 08/26/2018 13:56    Disposition: Discharge disposition: 01-Home or [place], [facility].   Specialty:  [place] Why:  M[place] will call to arrange initial visit Contact information: [address] [address] [phone]        Mcarthur Rossetti, MD Follow up in 2 week(s).   Specialty:  Orthopedic Surgery Contact information: [place] [address] [phone]            Signed: Mcarthur Rossetti 08/29/2018, 7:23 AM

## 2018-08-29 NOTE — Progress Notes (Signed)
Physical Therapy Treatment Patient Details Name: Renee Haynes MRN: [id number] DOB: 1950/09/17 Today's Date: 08/29/2018    History of Present Illness 68 yo female s/p L TKR on 08/26/18. PMH includes DOE, cancer, GERD, OA, R TKR 2009, posterior rectocele repair.     PT Comments    POD # 3 am session assisted OOB to amb to bathroom, amb in hallway then Then returned to room to perform some TE's following HEP handout.  Instructed on proper tech, freq as well as use of ICE.     Follow Up Recommendations  Follow surgeon's recommendation for DC plan and follow-up therapies;Supervision for mobility/OOB     Equipment Recommendations  Rolling walker with 5" wheels    Recommendations for Other Services       Precautions / Restrictions Precautions Precautions: Fall;Knee Required Braces or Orthoses: Knee Immobilizer - Left Restrictions Weight Bearing Restrictions: No Other Position/Activity Restrictions: WBAT     Mobility  Bed Mobility Overal bed mobility: Needs Assistance Bed Mobility: Supine to Sit     Supine to sit: Supervision     General bed mobility comments: pt self able with increased time   Transfers Overall transfer level: Needs assistance Equipment used: Rolling walker (2 wheeled) Transfers: Sit to/from [facility] Sit to Stand: Supervision Stand pivot transfers: Supervision       General transfer comment: good safety cognition and use of hands to steady self   Ambulation/Gait Ambulation/Gait assistance: Supervision Gait Distance (Feet): 120 Feet Assistive device: Rolling walker (2 wheeled) Gait Pattern/deviations: Decreased stride length;Decreased stance time - left;Step-through pattern Gait velocity: slightly decr    General Gait Details: min/guard for safety, verbal cues for sequencing, RW positioning, step length   Stairs             Wheelchair Mobility    Modified Rankin (Stroke Patients Only)       Balance Overall  balance assessment: Mild deficits observed, not formally tested                                          Cognition Arousal/Alertness: Awake/alert Behavior During Therapy: WFL for tasks assessed/performed Overall Cognitive Status: Within Functional Limits for tasks assessed                                        Exercises   Total Knee Replacement TE's 10 reps B LE ankle pumps 10 reps towel squeezes 10 reps knee presses 10 reps heel slides  10 reps SAQ's 10 reps SLR's 10 reps ABD Followed by ICE      General Comments        Pertinent Vitals/Pain Pain Assessment: 0-10 Pain Score: 4  Pain Location: L knee  Pain Descriptors / Indicators: Sore;Aching;Operative site guarding Pain Intervention(s): Monitored during session;Premedicated before session;Ice applied;Repositioned    Home Living                      Prior Function            PT Goals (current goals can now be found in the care plan section) Progress towards PT goals: Progressing toward goals    Frequency    7X/week      PT Plan Current plan remains appropriate    Co-evaluation  AM-PAC PT "6 Clicks" Mobility   Outcome Measure  Help needed turning from your back to your side while in a flat bed without using bedrails?: A Little Help needed moving from lying on your back to sitting on the side of a flat bed without using bedrails?: A Little Help needed moving to and from a bed to a chair (including a wheelchair)?: A Little Help needed standing up from a chair using your arms (e.g., wheelchair or bedside chair)?: A Little Help needed to walk in hospital room?: A Little Help needed climbing 3-5 steps with a railing? : A Little 6 Click Score: 18    End of Session Equipment Utilized During Treatment: Gait belt Activity Tolerance: Patient tolerated treatment well Patient left: in chair;with call bell/phone within reach Nurse Communication:  Mobility status PT Visit Diagnosis: Other abnormalities of gait and mobility (R26.89);Difficulty in walking, not elsewhere classified (R26.2)     Time: 1157-2620 PT Time Calculation (min) (ACUTE ONLY): 25 min  Charges:  $Gait Training: 8-22 mins $Therapeutic Exercise: 8-22 mins                     Rica Koyanagi  PTA Acute  Rehabilitation Services Pager      [phone] Office      [phone]

## 2018-08-29 NOTE — Progress Notes (Signed)
Occupational Therapy Treatment Patient Details Name: Renee Haynes MRN: [id number] DOB: 1950-08-22 Today's Date: 08/29/2018    History of present illness 68 yo female s/p L TKR on 08/26/18. PMH includes DOE, cancer, GERD, OA, R TKR 2009, posterior rectocele repair.    OT comments  Neighbor and son will A as needed.  Follow Up Recommendations  Home health OT;Supervision - Intermittent    Equipment Recommendations  None recommended by OT    Recommendations for Other Services      Precautions / Restrictions Precautions Precautions: Fall;Knee Required Braces or Orthoses: Knee Immobilizer - Left Restrictions Weight Bearing Restrictions: No Other Position/Activity Restrictions: WBAT        Mobility Bed Mobility               General bed mobility comments: pt OOB  Transfers Overall transfer level: Needs assistance Equipment used: Rolling walker (2 wheeled) Transfers: Sit to/from [employer] Transfers Sit to Stand: Supervision Stand pivot transfers: Supervision            Balance Overall balance assessment: Mild deficits observed, not formally tested                                         ADL either performed or assessed with clinical judgement   ADL Overall ADL's : Needs assistance/impaired     Grooming: Supervision/safety;Standing           Upper Body Dressing : Set up;Sitting   Lower Body Dressing: Supervision/safety;Sit to/from stand;Cueing for sequencing;Cueing for safety   Toilet Transfer: Supervision/safety;Comfort height toilet;Cueing for sequencing;Cueing for safety   Toileting- Clothing Manipulation and Hygiene: Supervision/safety;Sit to/from stand   Tub/ Banker: Copy Details (indicate cue type and reason): verbalized safety Functional mobility during ADLs: Supervision/safety General ADL Comments: Son will A pt as needed     Vision Patient Visual Report: No change from  baseline            Cognition Arousal/Alertness: Awake/alert Behavior During Therapy: WFL for tasks assessed/performed Overall Cognitive Status: Within Functional Limits for tasks assessed                                                     Pertinent Vitals/ Pain       Pain Score: 2  Pain Location: L knee  Pain Descriptors / Indicators: Sore;Aching Pain Intervention(s): Limited activity within patient's tolerance;Repositioned         Frequency  Min 2X/week        Progress Toward Goals  OT Goals(current goals can now be found in the care plan section)  Progress towards OT goals: Progressing toward goals     Plan Discharge plan remains appropriate       AM-PAC OT "6 Clicks" Daily Activity     Outcome Measure   Help from another person eating meals?: None Help from another person taking care of personal grooming?: None Help from another person toileting, which includes using toliet, bedpan, or urinal?: A Little Help from another person bathing (including washing, rinsing, drying)?: A Little Help from another person to put on and taking off regular upper body clothing?: None Help from another person to put on and taking off regular lower body clothing?: A  Little 6 Click Score: 21    End of Session Equipment Utilized During Treatment: Gait belt;Rolling walker  OT Visit Diagnosis: Unsteadiness on feet (R26.81)   Activity Tolerance Patient tolerated treatment well   Patient Left in chair;with call bell/phone within reach   Nurse Communication ([facility])        Time: 1031-1050 OT Time Calculation (min): 19 min  Charges: OT General Charges $OT Visit: 1 Visit OT Treatments $Self Care/Home Management : 8-22 mins  Kari Baars, [place] [phone] Office- [phone]      Kamerin Axford, Edwena Felty D 08/29/2018, 12:39 PM

## 2018-08-30 ENCOUNTER — Telehealth (INDEPENDENT_AMBULATORY_CARE_PROVIDER_SITE_OTHER): Payer: Self-pay | Admitting: Orthopaedic Surgery

## 2018-08-30 DIAGNOSIS — Z85828 Personal history of other malignant neoplasm of skin: Secondary | ICD-10-CM | POA: Diagnosis not present

## 2018-08-30 DIAGNOSIS — Z8701 Personal history of pneumonia (recurrent): Secondary | ICD-10-CM | POA: Diagnosis not present

## 2018-08-30 DIAGNOSIS — Z7982 Long term (current) use of aspirin: Secondary | ICD-10-CM | POA: Diagnosis not present

## 2018-08-30 DIAGNOSIS — Z9181 History of falling: Secondary | ICD-10-CM | POA: Diagnosis not present

## 2018-08-30 DIAGNOSIS — K219 Gastro-esophageal reflux disease without esophagitis: Secondary | ICD-10-CM | POA: Diagnosis not present

## 2018-08-30 DIAGNOSIS — Z96653 Presence of artificial knee joint, bilateral: Secondary | ICD-10-CM | POA: Diagnosis not present

## 2018-08-30 DIAGNOSIS — N816 Rectocele: Secondary | ICD-10-CM | POA: Diagnosis not present

## 2018-08-30 DIAGNOSIS — Z471 Aftercare following joint replacement surgery: Secondary | ICD-10-CM | POA: Diagnosis not present

## 2018-08-30 DIAGNOSIS — N200 Calculus of kidney: Secondary | ICD-10-CM | POA: Diagnosis not present

## 2018-08-30 NOTE — Telephone Encounter (Signed)
Verbal order given to Flor. °

## 2018-08-30 NOTE — Telephone Encounter (Signed)
[facility] called for verbal orders  2x times a week THIS week 3x a week for next week  [phone]

## 2018-09-02 DIAGNOSIS — Z8701 Personal history of pneumonia (recurrent): Secondary | ICD-10-CM | POA: Diagnosis not present

## 2018-09-02 DIAGNOSIS — Z7982 Long term (current) use of aspirin: Secondary | ICD-10-CM | POA: Diagnosis not present

## 2018-09-02 DIAGNOSIS — N816 Rectocele: Secondary | ICD-10-CM | POA: Diagnosis not present

## 2018-09-02 DIAGNOSIS — N200 Calculus of kidney: Secondary | ICD-10-CM | POA: Diagnosis not present

## 2018-09-02 DIAGNOSIS — K219 Gastro-esophageal reflux disease without esophagitis: Secondary | ICD-10-CM | POA: Diagnosis not present

## 2018-09-02 DIAGNOSIS — Z96653 Presence of artificial knee joint, bilateral: Secondary | ICD-10-CM | POA: Diagnosis not present

## 2018-09-02 DIAGNOSIS — Z9181 History of falling: Secondary | ICD-10-CM | POA: Diagnosis not present

## 2018-09-02 DIAGNOSIS — Z471 Aftercare following joint replacement surgery: Secondary | ICD-10-CM | POA: Diagnosis not present

## 2018-09-02 DIAGNOSIS — Z85828 Personal history of other malignant neoplasm of skin: Secondary | ICD-10-CM | POA: Diagnosis not present

## 2018-09-05 ENCOUNTER — Other Ambulatory Visit (INDEPENDENT_AMBULATORY_CARE_PROVIDER_SITE_OTHER): Payer: Self-pay | Admitting: Orthopaedic Surgery

## 2018-09-05 DIAGNOSIS — N200 Calculus of kidney: Secondary | ICD-10-CM | POA: Diagnosis not present

## 2018-09-05 DIAGNOSIS — K219 Gastro-esophageal reflux disease without esophagitis: Secondary | ICD-10-CM | POA: Diagnosis not present

## 2018-09-05 DIAGNOSIS — N816 Rectocele: Secondary | ICD-10-CM | POA: Diagnosis not present

## 2018-09-05 DIAGNOSIS — Z471 Aftercare following joint replacement surgery: Secondary | ICD-10-CM | POA: Diagnosis not present

## 2018-09-05 DIAGNOSIS — Z8701 Personal history of pneumonia (recurrent): Secondary | ICD-10-CM | POA: Diagnosis not present

## 2018-09-05 DIAGNOSIS — Z9181 History of falling: Secondary | ICD-10-CM | POA: Diagnosis not present

## 2018-09-05 DIAGNOSIS — Z96653 Presence of artificial knee joint, bilateral: Secondary | ICD-10-CM | POA: Diagnosis not present

## 2018-09-05 DIAGNOSIS — Z7982 Long term (current) use of aspirin: Secondary | ICD-10-CM | POA: Diagnosis not present

## 2018-09-05 DIAGNOSIS — Z85828 Personal history of other malignant neoplasm of skin: Secondary | ICD-10-CM | POA: Diagnosis not present

## 2018-09-05 NOTE — Telephone Encounter (Signed)
Please advise 

## 2018-09-08 ENCOUNTER — Telehealth (INDEPENDENT_AMBULATORY_CARE_PROVIDER_SITE_OTHER): Payer: Self-pay | Admitting: Orthopaedic Surgery

## 2018-09-08 ENCOUNTER — Encounter (INDEPENDENT_AMBULATORY_CARE_PROVIDER_SITE_OTHER): Payer: Self-pay | Admitting: Orthopaedic Surgery

## 2018-09-08 ENCOUNTER — Ambulatory Visit (INDEPENDENT_AMBULATORY_CARE_PROVIDER_SITE_OTHER): Payer: PPO | Admitting: Orthopaedic Surgery

## 2018-09-08 DIAGNOSIS — Z96652 Presence of left artificial knee joint: Secondary | ICD-10-CM

## 2018-09-08 NOTE — Telephone Encounter (Signed)
Patient called to request an application for a handicap placard.  CB#[phone].  Thank you.

## 2018-09-08 NOTE — Progress Notes (Signed)
The patient is 2 weeks tomorrow status post a left total knee arthroplasty.  She states she is doing well overall.  She is ready to progress soon to outpatient therapy.  However her car battery has died and she cannot get anything done until a week from now.  She has friends that are driving her right now.  Notes from home health therapy say that she is flexed her knee to 93 degrees which is excellent.  She denies any calf pain.  She is feeling better overall.  She is ready to drive.  We have refilled her pain medications.  She knows not to use pain medications when she drives.  On examination her left knee lacks full extension by just a few degrees and her flexion is to 90 degrees.  Her calf is soft.  Her incision is healing nicely and I placed new Steri-Strips.  At this point she can stop her aspirin.  She will transition outpatient therapy once she has her car situation straightened out so we will see if we can extend her home therapy for at least 1 more week.  I will see her back myself in 4 weeks to see how she is doing overall.  No x-rays are needed.

## 2018-09-09 NOTE — Telephone Encounter (Signed)
Ok for handicap placard

## 2018-09-09 NOTE — Telephone Encounter (Signed)
That will be fine.  I think Ashely had me sign one yesterday.

## 2018-09-09 NOTE — Telephone Encounter (Signed)
Handicap placard at front for pick up. Patient advised.

## 2018-09-12 DIAGNOSIS — Z7982 Long term (current) use of aspirin: Secondary | ICD-10-CM | POA: Diagnosis not present

## 2018-09-12 DIAGNOSIS — Z9181 History of falling: Secondary | ICD-10-CM | POA: Diagnosis not present

## 2018-09-12 DIAGNOSIS — K219 Gastro-esophageal reflux disease without esophagitis: Secondary | ICD-10-CM | POA: Diagnosis not present

## 2018-09-12 DIAGNOSIS — Z85828 Personal history of other malignant neoplasm of skin: Secondary | ICD-10-CM | POA: Diagnosis not present

## 2018-09-12 DIAGNOSIS — Z8701 Personal history of pneumonia (recurrent): Secondary | ICD-10-CM | POA: Diagnosis not present

## 2018-09-12 DIAGNOSIS — N816 Rectocele: Secondary | ICD-10-CM | POA: Diagnosis not present

## 2018-09-12 DIAGNOSIS — N200 Calculus of kidney: Secondary | ICD-10-CM | POA: Diagnosis not present

## 2018-09-12 DIAGNOSIS — Z471 Aftercare following joint replacement surgery: Secondary | ICD-10-CM | POA: Diagnosis not present

## 2018-09-12 DIAGNOSIS — Z96653 Presence of artificial knee joint, bilateral: Secondary | ICD-10-CM | POA: Diagnosis not present

## 2018-09-26 ENCOUNTER — Telehealth (INDEPENDENT_AMBULATORY_CARE_PROVIDER_SITE_OTHER): Payer: Self-pay | Admitting: Orthopaedic Surgery

## 2018-09-27 ENCOUNTER — Ambulatory Visit: Payer: PPO | Attending: Orthopaedic Surgery | Admitting: Physical Therapy

## 2018-09-27 ENCOUNTER — Telehealth (INDEPENDENT_AMBULATORY_CARE_PROVIDER_SITE_OTHER): Payer: Self-pay | Admitting: Orthopaedic Surgery

## 2018-09-27 ENCOUNTER — Other Ambulatory Visit: Payer: Self-pay

## 2018-09-27 ENCOUNTER — Encounter: Payer: Self-pay | Admitting: Physical Therapy

## 2018-09-27 DIAGNOSIS — M25562 Pain in left knee: Secondary | ICD-10-CM | POA: Diagnosis not present

## 2018-09-27 DIAGNOSIS — R600 Localized edema: Secondary | ICD-10-CM | POA: Diagnosis not present

## 2018-09-27 DIAGNOSIS — M6281 Muscle weakness (generalized): Secondary | ICD-10-CM | POA: Insufficient documentation

## 2018-09-27 DIAGNOSIS — M25662 Stiffness of left knee, not elsewhere classified: Secondary | ICD-10-CM | POA: Diagnosis not present

## 2018-09-27 NOTE — Therapy (Signed)
[place], [place], [address] Phone: [phone]   Fax:  [phone]  Physical Therapy Evaluation  Patient Details  Name: Renee Haynes MRN: [id number] Date of Birth: 1950/11/30 Referring Provider (PT): Jean Rosenthal, MD   Encounter Date: 09/27/2018  PT End of Session - 09/27/18 1516    Visit Number  1    Number of Visits  13    Date for PT Re-Evaluation  11/11/18    Authorization Type  Health Team Adv MCR   PN at visit 10    PT Start Time  1510   pt arrived late   PT Stop Time  1603    PT Time Calculation (min)  53 min    Activity Tolerance  Patient tolerated treatment well    Behavior During Therapy  [facility] for tasks assessed/performed       Past Medical History:  Diagnosis Date  . Abnormal EKG    recent w/u and neg echo  . Cancer ([place])    BCC chest and Squamous cell on chest  . Dysrhythmia    right bundle blanch block  . GERD (gastroesophageal reflux disease)   . History of kidney stones   . Hypothyroidism    hypothyroidism  . Osteoarthritis of right knee   . Pneumonia     Past Surgical History:  Procedure Laterality Date  . ABDOMINAL HYSTERECTOMY  2006  . COLONOSCOPY    . KNEE ARTHROPLASTY Right   . POLYPECTOMY    . RECTOCELE REPAIR  06/10/2011   Procedure: POSTERIOR REPAIR (RECTOCELE);  Surgeon: Elveria Royals;  Location: [facility];  Service: Gynecology;  Laterality: N/A;  . TOTAL KNEE ARTHROPLASTY Right 2009  . TOTAL KNEE ARTHROPLASTY Left 08/26/2018   Procedure: LEFT TOTAL KNEE ARTHROPLASTY;  Surgeon: Mcarthur Rossetti, MD;  Location: [facility];  Service: Orthopedics;  Laterality: Left;  . UPPER GASTROINTESTINAL ENDOSCOPY      There were no vitals filed for this visit.   Subjective Assessment - 09/27/18 1522    Subjective  It hurts, but no more than expected.     How long can you walk comfortably?  tire easily but not really limited, have not really pushed myself    Patient Stated Goals  be able to ride bike, bend    Currently in Pain?  Yes    Pain Score  5     Pain Location  Knee    Pain Orientation  Left    Pain Descriptors / Indicators  Aching    Pain Type  Surgical pain    Pain Onset  1 to 4 weeks ago    Aggravating Factors   walking/bending    Pain Relieving Factors  rest, ice         [facility] PT Assessment - 09/27/18 0001      Assessment   Medical Diagnosis  s/p Lt TKA    Referring Provider (PT)  Jean Rosenthal, MD    Onset Date/Surgical Date  08/26/18    Hand Dominance  Left    Next MD Visit  --   middle of March   Prior Therapy  home health 5 visits      Precautions   Precautions  None      Restrictions   Weight Bearing Restrictions  No      Balance Screen   Has the patient fallen in the past 6 months  No      Home Environment   Living  Environment  Private residence    Living Arrangements  Alone    Additional Comments  2 steps at home      Prior Function   Level of [place]  Retired    Biomedical scientist  work part time at [employer]   Overall Cognitive Status  [facility] [employer] for tasks assessed      Observation/Other Assessments   Focus on Therapeutic Outcomes (FOTO)   47% limited      Observation/Other Assessments-Edema    Edema  Circumferential      Circumferential Edema   Circumferential - Right  43    Circumferential - Left   46      Sensation   Additional Comments  numbness around knee      ROM / Strength   AROM / PROM / Strength  Strength;PROM      PROM   PROM Assessment Site  Knee    Right/Left Knee  Left    Left Knee Extension  -6    Left Knee Flexion  112      Strength   Strength Assessment Site  Hip;Knee    Right/Left Hip  Left    Left Hip Flexion  4-/5    Left Hip ABduction  4+/5    Right/Left Knee  Left    Left Knee Flexion  5/5    Left Knee Extension  5/5      Ambulation/Gait   Gait Comments  no AD, minimal heel  strike, lacking end range extension                Objective measurements completed on examination: See above findings.      [place] Adult PT Treatment/Exercise - 09/27/18 0001      Exercises   Exercises  Knee/Hip      Knee/Hip Exercises: Aerobic   Stationary Bike  5 min able to make full revolutions      Modalities   Modalities  Vasopneumatic      Vasopneumatic   Number Minutes Vasopneumatic   15 minutes    Vasopnuematic Location   Knee    Vasopneumatic Pressure  Low    Vasopneumatic Temperature   34             PT Education - 09/27/18 2137    Education Details  anatomy of condition, POC, HEP, exercise form/rationale, importance of RICE, HEP    Person(s) Educated  Patient    Methods  Explanation;Verbal cues    Comprehension  Verbalized understanding;Need further instruction          PT Long Term Goals - 09/27/18 2143      PT LONG TERM GOAL #1   Title  Pt will demo ROM 0-120 deg for necessary functional range    Baseline  see -6-112 at eval    Time  6    Period  Weeks    Status  New    Target Date  11/11/18      PT LONG TERM GOAL #2   Title  Pt will be able to navigate stairs step over ste for proper patterns at home and in the community    Baseline  leading with Rt at eval    Time  6    Period  Weeks    Status  New    Target Date  11/11/18      PT LONG TERM GOAL #3   Title  Pt will be able to demo SLS on Lt leg safely for 5s without UE support for necessary balance for daily ambulation    Baseline  unable at eval    Time  6    Period  Weeks    Status  New    Target Date  11/11/18      PT LONG TERM GOAL #4   Title  FOTO to 32% limited    Baseline  47% limited at eval    Time  6    Period  Weeks    Status  New    Target Date  11/11/18             Plan - 09/27/18 2138    Clinical Impression Statement  Pt presents to PT 4 weeks s/p Lt TKA with complaints of pain and decreased functional ability. Edema noted in Left knee as  expected. Good ROM and pt was able to make full revolutions on bike. Pt will benefit from skilled PT in order to meet long term functional goals.     Clinical Presentation  Stable    Clinical Decision Making  Low    Rehab Potential  Good    PT Frequency  2x / week    PT Duration  6 weeks    PT Treatment/Interventions  ADLs/Self Care Home Management;Cryotherapy;Electrical Stimulation;Moist Heat;Gait training;Stair training;Functional mobility training;Therapeutic activities;Therapeutic exercise;Balance training;Patient/family education;Manual techniques;Scar mobilization;Passive range of motion;Vasopneumatic Device;Taping    PT Next Visit Plan  bike, vaso for swelling, stretching    PT Home Exercise Plan  RICE 2x15 min daily, prop heel for extension    Consulted and Agree with Plan of Care  Patient       Patient will benefit from skilled therapeutic intervention in order to improve the following deficits and impairments:  Abnormal gait, Pain, Increased muscle spasms, Decreased activity tolerance, Decreased endurance, Decreased range of motion, Decreased strength, Impaired flexibility, Increased edema, Difficulty walking, Decreased balance  Visit Diagnosis: Acute pain of left knee - Plan: PT plan of care cert/re-cert  Stiffness of left knee, not elsewhere classified - Plan: PT plan of care cert/re-cert  Muscle weakness (generalized) - Plan: PT plan of care cert/re-cert  Localized edema - Plan: PT plan of care cert/re-cert     Problem List Patient Active Problem List   Diagnosis Date Noted  . Status post total left knee replacement 08/26/2018  . Unilateral primary osteoarthritis, left knee 08/15/2018  . Sprain of unspecified ligament of right ankle, initial encounter 06/29/2016  . Rectocele 06/10/2011  . Abnormal ECG 03/26/2011  . Dyspnea on exertion 03/26/2011   Anderson [place]brooks C. Jekhi Bolin PT, DPT 09/27/18 9:51 PM   [facility] [address] [place], [place], [address] Phone: [phone]   Fax:  [phone]  Name: Renee Haynes MRN: [id number] Date of Birth: 1951-05-12

## 2018-09-27 NOTE — Telephone Encounter (Signed)
Patient has a PT appointment today at 3 and has lost her RX.  She is wanting to get another one and is stopping by the office today before her appointment.  CB#[phone].  Thank you.

## 2018-09-27 NOTE — Telephone Encounter (Signed)
Patient did stop by, I have advised order is in chart and PT can see what they need.

## 2018-10-03 ENCOUNTER — Encounter: Payer: Self-pay | Admitting: Physical Therapy

## 2018-10-03 ENCOUNTER — Ambulatory Visit: Payer: PPO | Attending: Orthopaedic Surgery | Admitting: Physical Therapy

## 2018-10-03 DIAGNOSIS — M25562 Pain in left knee: Secondary | ICD-10-CM | POA: Diagnosis not present

## 2018-10-03 DIAGNOSIS — M6281 Muscle weakness (generalized): Secondary | ICD-10-CM | POA: Diagnosis not present

## 2018-10-03 DIAGNOSIS — R600 Localized edema: Secondary | ICD-10-CM | POA: Diagnosis not present

## 2018-10-03 DIAGNOSIS — M25662 Stiffness of left knee, not elsewhere classified: Secondary | ICD-10-CM | POA: Insufficient documentation

## 2018-10-03 NOTE — Therapy (Signed)
[place], [place], [address] Phone: [phone]   Fax:  [phone]  Physical Therapy Treatment  Patient Details  Name: Renee Haynes MRN: [id number] Date of Birth: 04/07/1951 Referring Provider (PT): Jean Rosenthal, MD   Encounter Date: 10/03/2018  PT End of Session - 10/03/18 1419    Visit Number  2    Number of Visits  13    Date for PT Re-Evaluation  11/11/18    Authorization Type  Health Team Adv MCR   PN at visit 10    PT Start Time  1330    PT Stop Time  1430    PT Time Calculation (min)  60 min    Activity Tolerance  Patient tolerated treatment well    Behavior During Therapy  [facility] for tasks assessed/performed       Past Medical History:  Diagnosis Date  . Abnormal EKG    recent w/u and neg echo  . Cancer ([place])    BCC chest and Squamous cell on chest  . Dysrhythmia    right bundle blanch block  . GERD (gastroesophageal reflux disease)   . History of kidney stones   . Hypothyroidism    hypothyroidism  . Osteoarthritis of right knee   . Pneumonia     Past Surgical History:  Procedure Laterality Date  . ABDOMINAL HYSTERECTOMY  2006  . COLONOSCOPY    . KNEE ARTHROPLASTY Right   . POLYPECTOMY    . RECTOCELE REPAIR  06/10/2011   Procedure: POSTERIOR REPAIR (RECTOCELE);  Surgeon: Elveria Royals;  Location: [facility];  Service: Gynecology;  Laterality: N/A;  . TOTAL KNEE ARTHROPLASTY Right 2009  . TOTAL KNEE ARTHROPLASTY Left 08/26/2018   Procedure: LEFT TOTAL KNEE ARTHROPLASTY;  Surgeon: Mcarthur Rossetti, MD;  Location: [facility];  Service: Orthopedics;  Laterality: Left;  . UPPER GASTROINTESTINAL ENDOSCOPY      There were no vitals filed for this visit.  Subjective Assessment - 10/03/18 1337    Subjective  Has been weaned from prescription medication.    I am worried I won't get the motion back.   I have been exercising about 15 minutes a day.  I stopped cane  about a week  and a half ago.     Currently in Pain?  Yes    Pain Score  6     Pain Location  Knee    Pain Orientation  Right    Pain Descriptors / Indicators  Aching;Tightness;Throbbing;Constant    Pain Type  Surgical pain    Aggravating Factors   walking,  bending   turning over in  bed    Pain Relieving Factors  rest ice.                         [place] Adult PT Treatment/Exercise - 10/03/18 0001      Self-Care   Self-Care  --   what to expect ,  how to elevate,  sleeping posture 2 pillow     Knee/Hip Exercises: Stretches   Quad Stretch Limitations  standing step stretch 10 x 10 seconds,  cued initially    Gastroc Stretch  Both;3 reps;30 seconds    Gastroc Stretch Limitations  incline.      Knee/Hip Exercises: Aerobic   Stationary Bike  5 min able to make full revolutions      Knee/Hip Exercises: Standing   Lateral Step Up  Left;1 set;10 reps;Hand Hold:  2;Step Height: 4"    Forward Step Up  1 set;Left;Hand Hold: 2;Step Height: 4"    Functional Squat  1 set;10 reps    Functional Squat Limitations  mini at counter,  cued initially    Other Standing Knee Exercises  12 feet heel walking      Knee/Hip Exercises: Seated   Heel Slides  10 reps;AROM    Hamstring Curl  2 sets;10 reps;Strengthening   red band     Knee/Hip Exercises: Supine   Quad Sets  10 reps;1 set;Left      Vasopneumatic   Number Minutes Vasopneumatic   15 minutes    Vasopnuematic Location   Knee    Vasopneumatic Pressure  Medium    Vasopneumatic Temperature   34      Manual Therapy   Manual therapy comments  retrograde  to decrease edema,  lymph system vacume activation,  tissue softened             PT Education - 10/03/18 1419    Education Details  self care    Person(s) Educated  Patient    Methods  Demonstration;Explanation;Tactile cues;Verbal cues    Comprehension  Returned demonstration;Verbalized understanding          PT Long Term Goals - 09/27/18 2143      PT LONG TERM GOAL #1    Title  Pt will demo ROM 0-120 deg for necessary functional range    Baseline  see -6-112 at eval    Time  6    Period  Weeks    Status  New    Target Date  11/11/18      PT LONG TERM GOAL #2   Title  Pt will be able to navigate stairs step over ste for proper patterns at home and in the community    Baseline  leading with Rt at eval    Time  6    Period  Weeks    Status  New    Target Date  11/11/18      PT LONG TERM GOAL #3   Title  Pt will be able to demo SLS on Lt leg safely for 5s without UE support for necessary balance for daily ambulation    Baseline  unable at eval    Time  6    Period  Weeks    Status  New    Target Date  11/11/18      PT LONG TERM GOAL #4   Title  FOTO to 32% limited    Baseline  47% limited at eval    Time  6    Period  Weeks    Status  New    Target Date  11/11/18            Plan - 10/03/18 1419    Clinical Impression Statement  ROM 116 AROM after exercise.  pain decreased to 3/10.  Adressed her concerns as able.  Referred her to her MD for questions about medication.     PT Next Visit Plan  bike, vaso for swelling, stretching    PT Home Exercise Plan  RICE 2x15 min daily, prop heel for extension    Consulted and Agree with Plan of Care  Patient       Patient will benefit from skilled therapeutic intervention in order to improve the following deficits and impairments:     Visit Diagnosis: Acute pain of left knee  Stiffness of left knee, not elsewhere classified  Muscle weakness (generalized)  Localized edema     Problem List Patient Active Problem List   Diagnosis Date Noted  . Status post total left knee replacement 08/26/2018  . Unilateral primary osteoarthritis, left knee 08/15/2018  . Sprain of unspecified ligament of right ankle, initial encounter 06/29/2016  . Rectocele 06/10/2011  . Abnormal ECG 03/26/2011  . Dyspnea on exertion 03/26/2011    Toshiyuki Fredell  PTA 10/03/2018, 2:21 PM  [facility] [address] [place], [place], [address] Phone: [phone]   Fax:  [phone]  Name: Renee Haynes MRN: [id number] Date of Birth: [birth year]

## 2018-10-05 ENCOUNTER — Encounter: Payer: Self-pay | Admitting: Physical Therapy

## 2018-10-05 ENCOUNTER — Ambulatory Visit: Payer: PPO | Admitting: Physical Therapy

## 2018-10-05 DIAGNOSIS — M25662 Stiffness of left knee, not elsewhere classified: Secondary | ICD-10-CM

## 2018-10-05 DIAGNOSIS — M6281 Muscle weakness (generalized): Secondary | ICD-10-CM

## 2018-10-05 DIAGNOSIS — R600 Localized edema: Secondary | ICD-10-CM

## 2018-10-05 DIAGNOSIS — M25562 Pain in left knee: Secondary | ICD-10-CM | POA: Diagnosis not present

## 2018-10-05 NOTE — Therapy (Signed)
[place], [place], [address] Phone: [phone]   Fax:  [phone]  Physical Therapy Treatment  Patient Details  Name: Renee Haynes MRN: [id number] Date of Birth: January 31, 1951 Referring Provider (PT): Jean Rosenthal, MD   Encounter Date: 10/05/2018  PT End of Session - 10/05/18 1335    Visit Number  3    Number of Visits  13    Date for PT Re-Evaluation  11/11/18    Authorization Type  Health Team Adv MCR   PN at visit 10    PT Start Time  1335    PT Stop Time  1427    PT Time Calculation (min)  52 min    Activity Tolerance  Patient tolerated treatment well       Past Medical History:  Diagnosis Date  . Abnormal EKG    recent w/u and neg echo  . Cancer ([place])    BCC chest and Squamous cell on chest  . Dysrhythmia    right bundle blanch block  . GERD (gastroesophageal reflux disease)   . History of kidney stones   . Hypothyroidism    hypothyroidism  . Osteoarthritis of right knee   . Pneumonia     Past Surgical History:  Procedure Laterality Date  . ABDOMINAL HYSTERECTOMY  2006  . COLONOSCOPY    . KNEE ARTHROPLASTY Right   . POLYPECTOMY    . RECTOCELE REPAIR  06/10/2011   Procedure: POSTERIOR REPAIR (RECTOCELE);  Surgeon: Elveria Royals;  Location: [facility];  Service: Gynecology;  Laterality: N/A;  . TOTAL KNEE ARTHROPLASTY Right 2009  . TOTAL KNEE ARTHROPLASTY Left 08/26/2018   Procedure: LEFT TOTAL KNEE ARTHROPLASTY;  Surgeon: Mcarthur Rossetti, MD;  Location: [facility];  Service: Orthopedics;  Laterality: Left;  . UPPER GASTROINTESTINAL ENDOSCOPY      There were no vitals filed for this visit.  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     Patient Stated Goals  be able to ride bike, bend    Currently in Pain?  Yes    Pain Score  6     Pain Location  Knee    Pain  Orientation  Right    Pain Descriptors / Indicators  Aching;Tightness    Pain Type  Surgical pain    Pain Onset  More than a month ago    Pain Frequency  Intermittent    Aggravating Factors   walking    Pain Relieving Factors  rest and ice         [facility] PT Assessment - 10/05/18 0001      Assessment   Medical Diagnosis  s/p Lt TKA    Referring Provider (PT)  Jean Rosenthal, MD    Onset Date/Surgical Date  08/26/18    Hand Dominance  Left      PROM   Left Knee Extension  -4    Left Knee Flexion  128                   [facility] Adult PT Treatment/Exercise - 10/05/18 0001      Knee/Hip Exercises: Stretches   Passive Hamstring Stretch  Left;3 reps;30 seconds   seated    Gastroc Stretch  Both;30 seconds;2 reps    Gastroc Stretch Limitations  off a step  Knee/Hip Exercises: Aerobic   Stationary Bike  L1x5'      Knee/Hip Exercises: Supine   Quad Sets  Left;AROM   30 reps quad sets with leg in HS stretch   Straight Leg Raise with External Rotation  Strengthening;Left;2 sets;10 reps   2 step lift   Other Supine Knee/Hip Exercises  15 reps heel slides with strap assist, then self mobs      Knee/Hip Exercises: Sidelying   Other Sidelying Knee/Hip Exercises  10 reps each pilates FWD/BWD kicks, CW/CCW cirlces, and FWD/BWD taps       Modalities   Modalities  Vasopneumatic      Vasopneumatic   Number Minutes Vasopneumatic   15 minutes    Vasopnuematic Location   Knee    Vasopneumatic Pressure  Medium    Vasopneumatic Temperature   34                  PT Long Term Goals - 10/05/18 1409      PT LONG TERM GOAL #1   Title  Pt will demo ROM 0-120 deg for necessary functional range    Status  On-going      PT LONG TERM GOAL #2   Title  Pt will be able to navigate stairs step over ste for proper patterns at home and in the community    Status  On-going      PT LONG TERM GOAL #3   Title  Pt will be able to demo SLS on Lt leg safely for 5s  without UE support for necessary balance for daily ambulation    Status  On-going      PT LONG TERM GOAL #4   Title  FOTO to 32% limited    Status  On-going            Plan - 10/05/18 1409    Clinical Impression Statement  Renee Haynes has improved Lt knee motion, she does fatigue with exercise for strengthening.      Rehab Potential  Good    PT Frequency  2x / week    PT Duration  6 weeks    PT Treatment/Interventions  ADLs/Self Care Home Management;Cryotherapy;Electrical Stimulation;Moist Heat;Gait training;Stair training;Functional mobility training;Therapeutic activities;Therapeutic exercise;Balance training;Patient/family education;Manual techniques;Scar mobilization;Passive range of motion;Vasopneumatic Device;Taping    PT Next Visit Plan  bike, vaso for swelling, stretching       Patient will benefit from skilled therapeutic intervention in order to improve the following deficits and impairments:  Abnormal gait, Pain, Increased muscle spasms, Decreased activity tolerance, Decreased endurance, Decreased range of motion, Decreased strength, Impaired flexibility, Increased edema, Difficulty walking, Decreased balance  Visit Diagnosis: Acute pain of left knee  Stiffness of left knee, not elsewhere classified  Muscle weakness (generalized)  Localized edema     Problem List Patient Active Problem List   Diagnosis Date Noted  . Status post total left knee replacement 08/26/2018  . Unilateral primary osteoarthritis, left knee 08/15/2018  . Sprain of unspecified ligament of right ankle, initial encounter 06/29/2016  . Rectocele 06/10/2011  . Abnormal ECG 03/26/2011  . Dyspnea on exertion 03/26/2011    Jeral Pinch PT  10/05/2018, 2:13 PM  [facility] [address] [place], [place], [address] Phone: [phone]   Fax:  [phone]  Name: Renee Haynes MRN: [id number] Date of Birth: 04-01-1951

## 2018-10-06 ENCOUNTER — Ambulatory Visit (INDEPENDENT_AMBULATORY_CARE_PROVIDER_SITE_OTHER): Payer: PPO | Admitting: Orthopaedic Surgery

## 2018-10-06 ENCOUNTER — Encounter (INDEPENDENT_AMBULATORY_CARE_PROVIDER_SITE_OTHER): Payer: Self-pay | Admitting: Orthopaedic Surgery

## 2018-10-06 DIAGNOSIS — Z96652 Presence of left artificial knee joint: Secondary | ICD-10-CM

## 2018-10-06 NOTE — Progress Notes (Signed)
Patient is a very pleasant 68 year old female who is now 6 weeks status post left total knee arthroplasty.  On exam her range of motion is entirely full.  She has excellent strength of that knee.  There is still swelling to be expected.  Calf is soft.  She did show me an area on her inner thigh that almost feels as if it is a hematoma.  This may have been from the adductor canal block or even a tourniquet.  I gave her reassurance and about worried about it thus far.  She is doing so well overall I do not need to see her back for 6 months.  At that visit I like an AP and lateral of her left operative knee.  All question concerns were answered and addressed.  Should continue increase her activities as comfort allows.

## 2018-10-10 ENCOUNTER — Ambulatory Visit: Payer: PPO | Admitting: Physical Therapy

## 2018-10-10 ENCOUNTER — Encounter: Payer: Self-pay | Admitting: Physical Therapy

## 2018-10-10 DIAGNOSIS — M25662 Stiffness of left knee, not elsewhere classified: Secondary | ICD-10-CM

## 2018-10-10 DIAGNOSIS — M6281 Muscle weakness (generalized): Secondary | ICD-10-CM

## 2018-10-10 DIAGNOSIS — R600 Localized edema: Secondary | ICD-10-CM

## 2018-10-10 DIAGNOSIS — M25562 Pain in left knee: Secondary | ICD-10-CM | POA: Diagnosis not present

## 2018-10-10 NOTE — Therapy (Addendum)
Renee Haynes, [place], [address] Phone: [phone]   Fax:  [phone]  Physical Therapy Treatment  Patient Details  Name: Renee Haynes MRN: [id number] Date of Birth: 1951/01/17 Referring Provider (PT): Jean Rosenthal, MD   Encounter Date: 10/10/2018  PT End of Session - 10/10/18 1422    Visit Number  4    Number of Visits  13    Date for PT Re-Evaluation  11/11/18    Authorization Type  Health Team Adv MCR   PN at visit 10    PT Start Time  1420    PT Stop Time  4235    PT Time Calculation (min)  54 min    Activity Tolerance  Patient tolerated treatment well    Behavior During Therapy  [facility] for tasks assessed/performed       Past Medical History:  Diagnosis Date  . Abnormal EKG    recent w/u and neg echo  . Cancer ([place])    BCC chest and Squamous cell on chest  . Dysrhythmia    right bundle blanch block  . GERD (gastroesophageal reflux disease)   . History of kidney stones   . Hypothyroidism    hypothyroidism  . Osteoarthritis of right knee   . Pneumonia     Past Surgical History:  Procedure Laterality Date  . ABDOMINAL HYSTERECTOMY  2006  . COLONOSCOPY    . KNEE ARTHROPLASTY Right   . POLYPECTOMY    . RECTOCELE REPAIR  06/10/2011   Procedure: POSTERIOR REPAIR (RECTOCELE);  Surgeon: Elveria Royals;  Location: [facility];  Service: Gynecology;  Laterality: N/A;  . TOTAL KNEE ARTHROPLASTY Right 2009  . TOTAL KNEE ARTHROPLASTY Left 08/26/2018   Procedure: LEFT TOTAL KNEE ARTHROPLASTY;  Surgeon: Mcarthur Rossetti, MD;  Location: [facility];  Service: Orthopedics;  Laterality: Left;  . UPPER GASTROINTESTINAL ENDOSCOPY      There were no vitals filed for this visit.  Subjective Assessment - 10/10/18 1423    Subjective  Doing okay. I feel like I am bending it but when I look in the mirror it is not that far.     Patient Stated Goals  be able to ride bike, bend    Currently in Pain?  Yes     Pain Location  Knee    Pain Orientation  Right    Pain Descriptors / Indicators  Sore    Aggravating Factors   weight bearing, bending    Pain Relieving Factors  RICE         [facility] PT Assessment - 10/10/18 0001      PROM   Left Knee Extension  0   with overpressure   Left Knee Flexion  121                   [facility] Adult PT Treatment/Exercise - 10/10/18 0001      Knee/Hip Exercises: Stretches   Passive Hamstring Stretch  Left;2 reps;30 seconds    Passive Hamstring Stretch Limitations  supine with strap      Knee/Hip Exercises: Aerobic   Stationary Bike  L1 5 min      Knee/Hip Exercises: Standing   Terminal Knee Extension  10 reps    Theraband Level (Terminal Knee Extension)  Level 4 (Blue)    Terminal Knee Extension Limitations  hand hold assist at // Environmental education officer Limitations  lateral static & dynamic    Other Standing Knee  Exercises  quad set at heel strike      Knee/Hip Exercises: Supine   Short Arc Quad Sets  Left;10 reps    Short Arc Quad Sets Limitations  2lb on ankle + mini SLR      Knee/Hip Exercises: Sidelying   Hip ABduction  Left;20 reps;10 reps      Vasopneumatic   Number Minutes Vasopneumatic   15 minutes    Vasopnuematic Location   Knee    Vasopneumatic Pressure  Medium    Vasopneumatic Temperature   34                  PT Long Term Goals - 10/05/18 1409      PT LONG TERM GOAL #1   Title  Pt will demo ROM 0-120 deg for necessary functional range    Status  On-going      PT LONG TERM GOAL #2   Title  Pt will be able to navigate stairs step over ste for proper patterns at home and in the community    Status  On-going      PT LONG TERM GOAL #3   Title  Pt will be able to demo SLS on Lt leg safely for 5s without UE support for necessary balance for daily ambulation    Status  On-going      PT LONG TERM GOAL #4   Title  FOTO to 32% limited    Status  On-going            Plan - 10/10/18 1447    Clinical  Impression Statement  exercises today focused on terminal knee ext at heel strike to discourage "limp" pattern. tends to shift weight to Right when standing and feels off to the left when she is lined up at midline. Cues for core engagemet/pelvic tilt to decrease anterior pelvic tilt causing back pain.     PT Treatment/Interventions  ADLs/Self Care Home Management;Cryotherapy;Electrical Stimulation;Moist Heat;Gait training;Stair training;Functional mobility training;Therapeutic activities;Therapeutic exercise;Balance training;Patient/family education;Manual techniques;Scar mobilization;Passive range of motion;Vasopneumatic Device;Taping    PT Next Visit Plan  TKE, recheck gait pattern, continue balance board.     PT Home Exercise Plan  RICE 2x15 min daily, prop heel for extension    Consulted and Agree with Plan of Care  Patient       Patient will benefit from skilled therapeutic intervention in order to improve the following deficits and impairments:  Abnormal gait, Pain, Increased muscle spasms, Decreased activity tolerance, Decreased endurance, Decreased range of motion, Decreased strength, Impaired flexibility, Increased edema, Difficulty walking, Decreased balance  Visit Diagnosis: Acute pain of left knee  Stiffness of left knee, not elsewhere classified  Muscle weakness (generalized)  Localized edema     Problem List Patient Active Problem List   Diagnosis Date Noted  . Status post total left knee replacement 08/26/2018  . Unilateral primary osteoarthritis, left knee 08/15/2018  . Sprain of unspecified ligament of right ankle, initial encounter 06/29/2016  . Rectocele 06/10/2011  . Abnormal ECG 03/26/2011  . Dyspnea on exertion 03/26/2011    Zafira Munos C. Janille Draughon PT, DPT 10/10/18 3:42 PM   [facility] [address] [place], [place], [address] Phone: [phone]   Fax:  [phone]  Name: Renee Haynes MRN:  [id number] Date of Birth: Oct 07, 1950

## 2018-10-12 ENCOUNTER — Encounter: Payer: PPO | Admitting: Physical Therapy

## 2018-10-13 ENCOUNTER — Other Ambulatory Visit: Payer: Self-pay

## 2018-10-13 ENCOUNTER — Ambulatory Visit: Payer: PPO | Admitting: Physical Therapy

## 2018-10-13 ENCOUNTER — Encounter: Payer: Self-pay | Admitting: Physical Therapy

## 2018-10-13 DIAGNOSIS — M6281 Muscle weakness (generalized): Secondary | ICD-10-CM

## 2018-10-13 DIAGNOSIS — M25662 Stiffness of left knee, not elsewhere classified: Secondary | ICD-10-CM

## 2018-10-13 DIAGNOSIS — M25562 Pain in left knee: Secondary | ICD-10-CM

## 2018-10-13 DIAGNOSIS — R600 Localized edema: Secondary | ICD-10-CM

## 2018-10-13 NOTE — Therapy (Signed)
[place], [place], [address] Phone: [phone]   Fax:  [phone]  Physical Therapy Treatment  Patient Details  Name: Renee Haynes MRN: [id number] Date of Birth: [birth year] Referring Provider (PT): Jean Rosenthal, MD   Encounter Date: 10/13/2018  PT End of Session - 10/13/18 1341    Visit Number  5    Number of Visits  13    Date for PT Re-Evaluation  11/11/18    Authorization Type  Health Team Adv MCR   PN at visit 10    PT Start Time  1337    PT Stop Time  1415    PT Time Calculation (min)  38 min    Activity Tolerance  Patient tolerated treatment well    Behavior During Therapy  [facility] for tasks assessed/performed       Past Medical History:  Diagnosis Date  . Abnormal EKG    recent w/u and neg echo  . Cancer ([place])    BCC chest and Squamous cell on chest  . Dysrhythmia    right bundle blanch block  . GERD (gastroesophageal reflux disease)   . History of kidney stones   . Hypothyroidism    hypothyroidism  . Osteoarthritis of right knee   . Pneumonia     Past Surgical History:  Procedure Laterality Date  . ABDOMINAL HYSTERECTOMY  2006  . COLONOSCOPY    . KNEE ARTHROPLASTY Right   . POLYPECTOMY    . RECTOCELE REPAIR  06/10/2011   Procedure: POSTERIOR REPAIR (RECTOCELE);  Surgeon: Elveria Royals;  Location: [facility];  Service: Gynecology;  Laterality: N/A;  . TOTAL KNEE ARTHROPLASTY Right 2009  . TOTAL KNEE ARTHROPLASTY Left 08/26/2018   Procedure: LEFT TOTAL KNEE ARTHROPLASTY;  Surgeon: Mcarthur Rossetti, MD;  Location: [facility];  Service: Orthopedics;  Laterality: Left;  . UPPER GASTROINTESTINAL ENDOSCOPY      There were no vitals filed for this visit.  Subjective Assessment - 10/13/18 1340    Subjective  Just the usual discomfort. Not bad, still swollen.     Patient Stated Goals  be able to ride bike, bend    Currently in Pain?  Yes    Pain Score  4     Pain Location   Knee    Pain Orientation  Right    Pain Descriptors / Indicators  Sore         [facility] PT Assessment - 10/13/18 0001      PROM   Left Knee Extension  0    Left Knee Flexion  124                   [facility] Adult PT Treatment/Exercise - 10/13/18 0001      Exercises   Exercises  Other Exercises    Other Exercises   HEP to do at work- see scanned instructions      Knee/Hip Exercises: Stretches   Passive Hamstring Stretch  Left;2 reps;30 seconds    Passive Hamstring Stretch Limitations  supine with strap    Knee: Self-Stretch to increase Flexion  Both;2 reps;30 seconds    Knee: Self-Stretch Limitations  prone quad stretch with strap      Knee/Hip Exercises: Aerobic   Stationary Bike  L1 5 min      Knee/Hip Exercises: Supine   Bridges with Ball Squeeze  15 reps   feet in dorsiflexion  PT Long Term Goals - 10/05/18 1409      PT LONG TERM GOAL #1   Title  Pt will demo ROM 0-120 deg for necessary functional range    Status  On-going      PT LONG TERM GOAL #2   Title  Pt will be able to navigate stairs step over ste for proper patterns at home and in the community    Status  On-going      PT LONG TERM GOAL #3   Title  Pt will be able to demo SLS on Lt leg safely for 5s without UE support for necessary balance for daily ambulation    Status  On-going      PT LONG TERM GOAL #4   Title  FOTO to 32% limited    Status  On-going            Plan - 10/13/18 1415    Clinical Impression Statement  ROM has reached 0-120 goal and pt is working on heel strike in gait. Feels that it occasionally is going to "give out" at heel strike. Will continue to benefit from strangthening and control in CKC to reduce this complaint.     PT Treatment/Interventions  ADLs/Self Care Home Management;Cryotherapy;Electrical Stimulation;Moist Heat;Gait training;Stair training;Functional mobility training;Therapeutic activities;Therapeutic exercise;Balance  training;Patient/family education;Manual techniques;Scar mobilization;Passive range of motion;Vasopneumatic Device;Taping    PT Next Visit Plan  CKC, how did work HEP go?    PT Home Exercise Plan  RICE 2x15 min daily, prop heel for extension; seated HSS; seated quad set + SLR; sit<>stand, heel raises, gastroc stretch standing    Consulted and Agree with Plan of Care  Patient       Patient will benefit from skilled therapeutic intervention in order to improve the following deficits and impairments:  Abnormal gait, Pain, Increased muscle spasms, Decreased activity tolerance, Decreased endurance, Decreased range of motion, Decreased strength, Impaired flexibility, Increased edema, Difficulty walking, Decreased balance  Visit Diagnosis: Acute pain of left knee  Stiffness of left knee, not elsewhere classified  Muscle weakness (generalized)  Localized edema     Problem List Patient Active Problem List   Diagnosis Date Noted  . Status post total left knee replacement 08/26/2018  . Unilateral primary osteoarthritis, left knee 08/15/2018  . Sprain of unspecified ligament of right ankle, initial encounter 06/29/2016  . Rectocele 06/10/2011  . Abnormal ECG 03/26/2011  . Dyspnea on exertion 03/26/2011    Pascual Mantel C. Marshall Roehrich PT, DPT 10/13/18 2:17 PM   [facility] [address] [place], [place], [address] Phone: [phone]   Fax:  [phone]  Name: Renee Haynes MRN: [id number] Date of Birth: [birth year]

## 2018-10-17 ENCOUNTER — Encounter: Payer: Self-pay | Admitting: Physical Therapy

## 2018-10-17 ENCOUNTER — Other Ambulatory Visit: Payer: Self-pay

## 2018-10-17 ENCOUNTER — Ambulatory Visit: Payer: PPO | Admitting: Physical Therapy

## 2018-10-17 DIAGNOSIS — R600 Localized edema: Secondary | ICD-10-CM

## 2018-10-17 DIAGNOSIS — M25562 Pain in left knee: Secondary | ICD-10-CM | POA: Diagnosis not present

## 2018-10-17 DIAGNOSIS — M6281 Muscle weakness (generalized): Secondary | ICD-10-CM

## 2018-10-17 DIAGNOSIS — M25662 Stiffness of left knee, not elsewhere classified: Secondary | ICD-10-CM

## 2018-10-17 NOTE — Therapy (Signed)
[place], [place], [address] Phone: [phone]   Fax:  [phone]  Physical Therapy Treatment  Patient Details  Name: Renee Haynes MRN: [id number] Date of Birth: 15-Mar-1951 Referring Provider (PT): Jean Rosenthal, MD   Encounter Date: 10/17/2018  PT End of Session - 10/17/18 1341    Visit Number  6    Number of Visits  13    Date for PT Re-Evaluation  11/11/18    Authorization Type  Health Team Adv MCR   PN at visit 10    PT Start Time  1330    PT Stop Time  1414    PT Time Calculation (min)  44 min    Activity Tolerance  Patient tolerated treatment well    Behavior During Therapy  [facility] for tasks assessed/performed       Past Medical History:  Diagnosis Date  . Abnormal EKG    recent w/u and neg echo  . Cancer ([place])    BCC chest and Squamous cell on chest  . Dysrhythmia    right bundle blanch block  . GERD (gastroesophageal reflux disease)   . History of kidney stones   . Hypothyroidism    hypothyroidism  . Osteoarthritis of right knee   . Pneumonia     Past Surgical History:  Procedure Laterality Date  . ABDOMINAL HYSTERECTOMY  2006  . COLONOSCOPY    . KNEE ARTHROPLASTY Right   . POLYPECTOMY    . RECTOCELE REPAIR  06/10/2011   Procedure: POSTERIOR REPAIR (RECTOCELE);  Surgeon: Elveria Royals;  Location: [facility];  Service: Gynecology;  Laterality: N/A;  . TOTAL KNEE ARTHROPLASTY Right 2009  . TOTAL KNEE ARTHROPLASTY Left 08/26/2018   Procedure: LEFT TOTAL KNEE ARTHROPLASTY;  Surgeon: Mcarthur Rossetti, MD;  Location: [facility];  Service: Orthopedics;  Laterality: Left;  . UPPER GASTROINTESTINAL ENDOSCOPY      There were no vitals filed for this visit.  Subjective Assessment - 10/17/18 1335    Subjective  I have been stretching at home.  Swelling off and on. The bench at work was not steady enough to do.      Currently in Pain?  Yes    Pain Score  4     Pain Location   Knee    Pain Orientation  Left    Pain Descriptors / Indicators  Sore;Tightness    Pain Type  Surgical pain    Pain Onset  More than a month ago    Pain Frequency  Intermittent    Aggravating Factors   walking, bending     Pain Relieving Factors  RICE          [facility] Adult PT Treatment/Exercise - 10/17/18 0001      Knee/Hip Exercises: Stretches   Knee: Self-Stretch to increase Flexion  Both;2 reps;30 seconds    Knee: Self-Stretch Limitations  prone quad stretch with strap    Gastroc Stretch  Right;Left;Both;3 reps    Gastroc Stretch Limitations  used slant board and wall       Knee/Hip Exercises: Aerobic   Stationary Bike  L2 , 6 min       Knee/Hip Exercises: Machines for Strengthening   Cybex Leg Press  1 plate double leg  x 20, single leg x 15 wiht light Rt LE asst       Knee/Hip Exercises: Standing   Forward Step Up  Left;1 set;2 sets;20 reps;Hand Hold: 1;Hand Hold: 0;Step Height: 4";Step Height:  6"    Forward Step Up Limitations  weak abd noted, cues for alignment     Wall Squat  1 set;15 reps      Knee/Hip Exercises: Seated   Sit to Sand  2 sets;10 reps;without UE support   blue band for 2nd set x 10      Knee/Hip Exercises: Prone   Hamstring Curl  2 sets;10 reps    Hip Extension  Strengthening;Left;2 sets;10 reps             PT Education - 10/17/18 1415    Education Details  LE alignment for sit to stand, squats     Person(s) Educated  Patient    Methods  Explanation    Comprehension  Returned demonstration;Verbalized understanding          PT Long Term Goals - 10/17/18 1347      PT LONG TERM GOAL #1   Title  Pt will demo ROM 0-120 deg for necessary functional range    Baseline  0-120    Status  Achieved      PT LONG TERM GOAL #2   Title  Pt will be able to navigate stairs step over ste for proper patterns at home and in the community    Baseline  step ups done today     Status  On-going      PT LONG TERM GOAL #3   Title  Pt will be able to  demo SLS on Lt leg safely for 5s without UE support for necessary balance for daily ambulation    Status  On-going      PT LONG TERM GOAL #4   Title  FOTO to 32% limited    Status  On-going            Plan - 10/17/18 1343    Clinical Impression Statement  Patient with excellent mobility overall, needs to work on closed chain balance and stability, step ups.  Declined modalities post.  Goals in progress.     PT Frequency  2x / week    PT Duration  6 weeks    PT Treatment/Interventions  ADLs/Self Care Home Management;Cryotherapy;Electrical Stimulation;Moist Heat;Gait training;Stair training;Functional mobility training;Therapeutic activities;Therapeutic exercise;Balance training;Patient/family education;Manual techniques;Scar mobilization;Passive range of motion;Vasopneumatic Device;Taping    PT Next Visit Plan  CKC, step ups, SLS ,squats, hip strength     PT Home Exercise Plan  RICE 2x15 min daily, prop heel for extension; seated HSS; seated quad set + SLR; sit<>stand, heel raises, gastroc stretch standing    Consulted and Agree with Plan of Care  Patient       Patient will benefit from skilled therapeutic intervention in order to improve the following deficits and impairments:  Abnormal gait, Pain, Increased muscle spasms, Decreased activity tolerance, Decreased endurance, Decreased range of motion, Decreased strength, Impaired flexibility, Increased edema, Difficulty walking, Decreased balance  Visit Diagnosis: Acute pain of left knee  Stiffness of left knee, not elsewhere classified  Muscle weakness (generalized)  Localized edema     Problem List Patient Active Problem List   Diagnosis Date Noted  . Status post total left knee replacement 08/26/2018  . Unilateral primary osteoarthritis, left knee 08/15/2018  . Sprain of unspecified ligament of right ankle, initial encounter 06/29/2016  . Rectocele 06/10/2011  . Abnormal ECG 03/26/2011  . Dyspnea on exertion 03/26/2011     Sharmane Dame 10/17/2018, 2:20 PM  [facility] [address] [place], [place], [address] Phone: [phone]  Fax:  [phone]  Name: Rawan Riendeau MRN: [id number] Date of Birth: [birth year]  Raeford Razor, PT 10/17/18 2:20 PM Phone: [phone] Fax: [phone]

## 2018-10-19 ENCOUNTER — Encounter: Payer: Self-pay | Admitting: Physical Therapy

## 2018-10-19 ENCOUNTER — Ambulatory Visit: Payer: PPO | Admitting: Physical Therapy

## 2018-10-19 ENCOUNTER — Other Ambulatory Visit: Payer: Self-pay

## 2018-10-19 DIAGNOSIS — M6281 Muscle weakness (generalized): Secondary | ICD-10-CM

## 2018-10-19 DIAGNOSIS — M25562 Pain in left knee: Secondary | ICD-10-CM

## 2018-10-19 DIAGNOSIS — R600 Localized edema: Secondary | ICD-10-CM

## 2018-10-19 DIAGNOSIS — M25662 Stiffness of left knee, not elsewhere classified: Secondary | ICD-10-CM

## 2018-10-19 NOTE — Therapy (Signed)
[place], [place], [address] Phone: [phone]   Fax:  [phone]  Physical Therapy Treatment  Patient Details  Name: Renee Haynes MRN: [id number] Date of Birth: 03/16/1951 Referring Provider (PT): Jean Rosenthal, MD   Encounter Date: 10/19/2018  PT End of Session - 10/19/18 1354    Visit Number  7    Number of Visits  13    Date for PT Re-Evaluation  11/11/18    Authorization Type  Health Team Adv MCR   PN at visit 10    PT Start Time  1345    PT Stop Time  1435    PT Time Calculation (min)  50 min    Activity Tolerance  Patient tolerated treatment well    Behavior During Therapy  [facility] for tasks assessed/performed       Past Medical History:  Diagnosis Date  . Abnormal EKG    recent w/u and neg echo  . Cancer ([place])    BCC chest and Squamous cell on chest  . Dysrhythmia    right bundle blanch block  . GERD (gastroesophageal reflux disease)   . History of kidney stones   . Hypothyroidism    hypothyroidism  . Osteoarthritis of right knee   . Pneumonia     Past Surgical History:  Procedure Laterality Date  . ABDOMINAL HYSTERECTOMY  2006  . COLONOSCOPY    . KNEE ARTHROPLASTY Right   . POLYPECTOMY    . RECTOCELE REPAIR  06/10/2011   Procedure: POSTERIOR REPAIR (RECTOCELE);  Surgeon: Elveria Royals;  Location: [facility];  Service: Gynecology;  Laterality: N/A;  . TOTAL KNEE ARTHROPLASTY Right 2009  . TOTAL KNEE ARTHROPLASTY Left 08/26/2018   Procedure: LEFT TOTAL KNEE ARTHROPLASTY;  Surgeon: Mcarthur Rossetti, MD;  Location: [facility];  Service: Orthopedics;  Laterality: Left;  . UPPER GASTROINTESTINAL ENDOSCOPY      There were no vitals filed for this visit.  Subjective Assessment - 10/19/18 1351    Subjective  My knee feels good.  I have been working today, too.  Has been so busy so she has not done much of her exercises.     How long can you stand comfortably?  can stand 2  hours or more at work short sitting break then get back up.     Currently in Pain?  Yes    Pain Score  2     Pain Location  Knee    Pain Orientation  Left    Pain Descriptors / Indicators  Discomfort    Pain Type  Surgical pain    Pain Onset  More than a month ago    Pain Frequency  Intermittent    Aggravating Factors   walking, stairs     Pain Relieving Factors  RICE          [facility] Adult PT Treatment/Exercise - 10/19/18 0001      Pilates   Pilates Reformer  Footwork 2 Red 1 blue See note       Knee/Hip Exercises: Stretches   Active Hamstring Stretch  Left;3 reps    Active Hamstring Stretch Limitations  sheet     Knee: Self-Stretch to increase Flexion  Left;3 reps;30 seconds      Knee/Hip Exercises: Aerobic   Stationary Bike  L2 , 6 min       Knee/Hip Exercises: Standing   Forward Lunges  Both;1 set;10 reps    Lateral Step Up  Left;1  set;15 reps;Hand Hold: 1;Step Height: 8"    Forward Step Up  Left;1 set;15 reps;Hand Hold: 1;Step Height: 8"      Modalities   Modalities  Cryotherapy      Cryotherapy   Number Minutes Cryotherapy  5 Minutes    Cryotherapy Location  Knee    Type of Cryotherapy  Ice pack       Pilates Reformer used for LE/core strength, postural strength, lumbopelvic disassociation and core control.  Exercises included:  Footwork:  double and single leg, heels parallel, turnout variations to challenge terminal knee extension and control.   Bridging  3 springs    X 10 cues for articulation , added 2 last springs for assistance   Feet in Straps 1 Red 1 Yellow  Arcs x 10 slow with mod cues to control straps, breathing, etc.   Squat x 10 with turnout      PT Long Term Goals - 10/17/18 1347      PT LONG TERM GOAL #1   Title  Pt will demo ROM 0-120 deg for necessary functional range    Baseline  0-120    Status  Achieved      PT LONG TERM GOAL #2   Title  Pt will be able to navigate stairs step over ste for proper patterns at home and in the  community    Baseline  step ups done today     Status  On-going      PT LONG TERM GOAL #3   Title  Pt will be able to demo SLS on Lt leg safely for 5s without UE support for necessary balance for daily ambulation    Status  On-going      PT LONG TERM GOAL #4   Title  FOTO to 32% limited    Status  On-going            Plan - 10/19/18 1431    Clinical Impression Statement  Pt tolerated closed chain exercises well today, added in Reformer for some core stability. Progressing well with both ROM and strength    PT Treatment/Interventions  ADLs/Self Care Home Management;Cryotherapy;Electrical Stimulation;Moist Heat;Gait training;Stair training;Functional mobility training;Therapeutic activities;Therapeutic exercise;Balance training;Patient/family education;Manual techniques;Scar mobilization;Passive range of motion;Vasopneumatic Device;Taping    PT Next Visit Plan  measure, CKC, step ups, SLS ,squats, hip strength , try Reformer again     PT Home Exercise Plan  RICE 2x15 min daily, prop heel for extension; seated HSS; seated quad set + SLR; sit<>stand, heel raises, gastroc stretch standing    Consulted and Agree with Plan of Care  Patient       Patient will benefit from skilled therapeutic intervention in order to improve the following deficits and impairments:  Abnormal gait, Pain, Increased muscle spasms, Decreased activity tolerance, Decreased endurance, Decreased range of motion, Decreased strength, Impaired flexibility, Increased edema, Difficulty walking, Decreased balance  Visit Diagnosis: Acute pain of left knee  Stiffness of left knee, not elsewhere classified  Muscle weakness (generalized)  Localized edema     Problem List Patient Active Problem List   Diagnosis Date Noted  . Status post total left knee replacement 08/26/2018  . Unilateral primary osteoarthritis, left knee 08/15/2018  . Sprain of unspecified ligament of right ankle, initial encounter 06/29/2016  .  Rectocele 06/10/2011  . Abnormal ECG 03/26/2011  . Dyspnea on exertion 03/26/2011    , 10/19/2018, 2:37 PM  [facility] [address] [place], [place], [address] Phone: [phone]  Fax:  [phone]  Name: Renee Haynes MRN: [id number] Date of Birth: February 06, 1951  Raeford Razor, PT 10/19/18 2:37 PM Phone: [phone] Fax: [phone]

## 2018-10-24 ENCOUNTER — Ambulatory Visit: Payer: PPO

## 2018-10-26 ENCOUNTER — Ambulatory Visit: Payer: PPO | Admitting: Physical Therapy

## 2018-10-26 ENCOUNTER — Encounter: Payer: Self-pay | Admitting: Physical Therapy

## 2018-10-26 ENCOUNTER — Other Ambulatory Visit: Payer: Self-pay

## 2018-10-26 DIAGNOSIS — M6281 Muscle weakness (generalized): Secondary | ICD-10-CM

## 2018-10-26 DIAGNOSIS — M25562 Pain in left knee: Secondary | ICD-10-CM

## 2018-10-26 DIAGNOSIS — M25662 Stiffness of left knee, not elsewhere classified: Secondary | ICD-10-CM

## 2018-10-26 DIAGNOSIS — R600 Localized edema: Secondary | ICD-10-CM

## 2018-10-26 NOTE — Therapy (Signed)
[place], [place], [address] Phone: [phone]   Fax:  [phone]  Physical Therapy Treatment  Patient Details  Name: Renee Haynes MRN: [id number] Date of Birth: 12-07-1950 Referring Provider (PT): Jean Rosenthal, MD   Encounter Date: 10/26/2018  PT End of Session - 10/26/18 1236    Visit Number  8    Number of Visits  13    Date for PT Re-Evaluation  11/11/18    Authorization Type  Health Team Adv MCR   PN at visit 10    PT Start Time  6063    PT Stop Time  1313    PT Time Calculation (min)  38 min    Activity Tolerance  Patient tolerated treatment well    Behavior During Therapy  [facility] for tasks assessed/performed       Past Medical History:  Diagnosis Date  . Abnormal EKG    recent w/u and neg echo  . Cancer ([place])    BCC chest and Squamous cell on chest  . Dysrhythmia    right bundle blanch block  . GERD (gastroesophageal reflux disease)   . History of kidney stones   . Hypothyroidism    hypothyroidism  . Osteoarthritis of right knee   . Pneumonia     Past Surgical History:  Procedure Laterality Date  . ABDOMINAL HYSTERECTOMY  2006  . COLONOSCOPY    . KNEE ARTHROPLASTY Right   . POLYPECTOMY    . RECTOCELE REPAIR  06/10/2011   Procedure: POSTERIOR REPAIR (RECTOCELE);  Surgeon: Elveria Royals;  Location: [facility];  Service: Gynecology;  Laterality: N/A;  . TOTAL KNEE ARTHROPLASTY Right 2009  . TOTAL KNEE ARTHROPLASTY Left 08/26/2018   Procedure: LEFT TOTAL KNEE ARTHROPLASTY;  Surgeon: Mcarthur Rossetti, MD;  Location: [facility];  Service: Orthopedics;  Laterality: Left;  . UPPER GASTROINTESTINAL ENDOSCOPY      There were no vitals filed for this visit.  Subjective Assessment - 10/26/18 1237    Subjective  Feeling much better.     Patient Stated Goals  be able to ride bike, bend    Currently in Pain?  No/denies         [facility] PT Assessment - 10/26/18 0001      Assessment   Medical Diagnosis  s/p Lt TKA    Referring Provider (PT)  Jean Rosenthal, MD    Onset Date/Surgical Date  08/26/18    Hand Dominance  Left    Next MD Visit  --   Aug or July     Precautions   Precautions  None      Restrictions   Weight Bearing Restrictions  No      Prior Function   Level of Independence  Independent      Cognition   Overall Cognitive Status  Within Functional Limits for tasks assessed      Sensation   Additional Comments  WFL      PROM   Left Knee Extension  1    Left Knee Flexion  122   without OP                  [facility] Adult PT Treatment/Exercise - 10/26/18 0001      Pilates   Pilates Reformer  see PT note      Knee/Hip Exercises: Stretches   Passive Hamstring Stretch Limitations  ITB with strap 2x30s    Quad Stretch  Both;2 reps;30 seconds  Quad Stretch Limitations  prone with strap      Knee/Hip Exercises: Aerobic   Stationary Bike  5 min L2           Pilates Reformer used for LE/core strength, postural strength, lumbopelvic disassociation and core control.  Exercises included: Footwork 2 red 1 blue with double feet; 1 red 1 blue single foot ball bw knees Bridging 2 red 1 blue, ball bw knees Feet in Straps 1 red 1 blue           PT Long Term Goals - 10/17/18 1347      PT LONG TERM GOAL #1   Title  Pt will demo ROM 0-120 deg for necessary functional range    Baseline  0-120    Status  Achieved      PT LONG TERM GOAL #2   Title  Pt will be able to navigate stairs step over ste for proper patterns at home and in the community    Baseline  step ups done today     Status  On-going      PT LONG TERM GOAL #3   Title  Pt will be able to demo SLS on Lt leg safely for 5s without UE support for necessary balance for daily ambulation    Status  On-going      PT LONG TERM GOAL #4   Title  FOTO to 32% limited    Status  On-going            Plan - 10/26/18 1350    Clinical Impression  Statement  pt able to maintain ROM and achieve full range without overpressure. Good tolerance to exercises on reformer but requires tactile cues for control. Will continue to benefit from gross lumbopelvic and LE strengthening.     PT Treatment/Interventions  ADLs/Self Care Home Management;Cryotherapy;Electrical Stimulation;Moist Heat;Gait training;Stair training;Functional mobility training;Therapeutic activities;Therapeutic exercise;Balance training;Patient/family education;Manual techniques;Scar mobilization;Passive range of motion;Vasopneumatic Device;Taping    PT Next Visit Plan  continue CKC, balance; 10th visit PN coming up    PT Home Exercise Plan  RICE 2x15 min daily, prop heel for extension; seated HSS; seated quad set + SLR; sit<>stand, heel raises, gastroc stretch standing    Consulted and Agree with Plan of Care  Patient       Patient will benefit from skilled therapeutic intervention in order to improve the following deficits and impairments:  Abnormal gait, Pain, Increased muscle spasms, Decreased activity tolerance, Decreased endurance, Decreased range of motion, Decreased strength, Impaired flexibility, Increased edema, Difficulty walking, Decreased balance  Visit Diagnosis: Acute pain of left knee  Stiffness of left knee, not elsewhere classified  Muscle weakness (generalized)  Localized edema     Problem List Patient Active Problem List   Diagnosis Date Noted  . Status post total left knee replacement 08/26/2018  . Unilateral primary osteoarthritis, left knee 08/15/2018  . Sprain of unspecified ligament of right ankle, initial encounter 06/29/2016  . Rectocele 06/10/2011  . Abnormal ECG 03/26/2011  . Dyspnea on exertion 03/26/2011   Jerri Hargadon C. Lurene Robley PT, DPT 10/26/18 1:54 PM   [facility] [address] [place], [place], [address] Phone: [phone]   Fax:  [phone]  Name: Renee Haynes MRN:  [id number] Date of Birth: [birth year]

## 2018-11-01 ENCOUNTER — Other Ambulatory Visit: Payer: Self-pay

## 2018-11-01 ENCOUNTER — Ambulatory Visit: Payer: PPO | Admitting: Physical Therapy

## 2018-11-01 DIAGNOSIS — M6281 Muscle weakness (generalized): Secondary | ICD-10-CM

## 2018-11-01 DIAGNOSIS — R600 Localized edema: Secondary | ICD-10-CM

## 2018-11-01 DIAGNOSIS — M25562 Pain in left knee: Secondary | ICD-10-CM

## 2018-11-01 DIAGNOSIS — M25662 Stiffness of left knee, not elsewhere classified: Secondary | ICD-10-CM

## 2018-11-01 NOTE — Therapy (Signed)
[place], [place], [address] Phone: [phone]   Fax:  [phone]  Physical Therapy Treatment  Patient Details  Name: Renee Haynes MRN: [id number] Date of Birth: 10-01-1950 Referring Provider (PT): Jean Rosenthal, MD   Encounter Date: 11/01/2018  PT End of Session - 11/01/18 1114    Visit Number  9    Number of Visits  13    Date for PT Re-Evaluation  11/11/18    Authorization Type  Health Team Adv MCR   PN at visit 10    PT Start Time  1103    PT Stop Time  1153    PT Time Calculation (min)  50 min    Activity Tolerance  Patient tolerated treatment well    Behavior During Therapy  [name] for tasks assessed/performed       Past Medical History:  Diagnosis Date  . Abnormal EKG    recent w/u and neg echo  . Cancer ([place])    BCC chest and Squamous cell on chest  . Dysrhythmia    right bundle blanch block  . GERD (gastroesophageal reflux disease)   . History of kidney stones   . Hypothyroidism    hypothyroidism  . Osteoarthritis of right knee   . Pneumonia     Past Surgical History:  Procedure Laterality Date  . ABDOMINAL HYSTERECTOMY  2006  . COLONOSCOPY    . KNEE ARTHROPLASTY Right   . POLYPECTOMY    . RECTOCELE REPAIR  06/10/2011   Procedure: POSTERIOR REPAIR (RECTOCELE);  Surgeon: Elveria Royals;  Location: [facility];  Service: Gynecology;  Laterality: N/A;  . TOTAL KNEE ARTHROPLASTY Right 2009  . TOTAL KNEE ARTHROPLASTY Left 08/26/2018   Procedure: LEFT TOTAL KNEE ARTHROPLASTY;  Surgeon: Mcarthur Rossetti, MD;  Location: [facility];  Service: Orthopedics;  Laterality: Left;  . UPPER GASTROINTESTINAL ENDOSCOPY      There were no vitals filed for this visit.  Subjective Assessment - 11/01/18 1105    Subjective  No pain in the knee today, doing well. Able to mow her grass.  I need lots of bending and stretching.     Currently in Pain?  No/denies         [place] PT Assessment -  11/01/18 0001      PROM   Left Knee Flexion  126           [facility] Adult PT Treatment/Exercise - 11/01/18 0001      Knee/Hip Exercises: Stretches   Active Hamstring Stretch  Left;3 reps;30 seconds    Knee: Self-Stretch to increase Flexion  Left;5 reps    Knee: Self-Stretch Limitations  supine with strap     ITB Stretch  Left;3 reps;30 seconds      Knee/Hip Exercises: Aerobic   Stationary Bike  5 min L2       Knee/Hip Exercises: Machines for Strengthening   Cybex Leg Press  1 plate x 15, 2 plates x 15, single leg 1 plate x 10       Knee/Hip Exercises: Standing   Hip Flexion  Stengthening;Right;1 set    Hip Flexion Limitations  for dynamic balance     Forward Lunges Limitations  split squat small ROM in mirror    Hip ADduction  Strengthening;Both;1 set;10 reps    SLS  6 sec at best       Knee/Hip Exercises: Supine   Heel Slides Limitations  with ball     Constance Haw  Strengthening;Both;2 sets;10 reps    Bridges Limitations  on ball . 1 set with knee slightly bent       Cryotherapy   Number Minutes Cryotherapy  8 Minutes    Cryotherapy Location  Knee    Type of Cryotherapy  Ice pack          PT Long Term Goals - 11/01/18 1131      PT LONG TERM GOAL #1   Title  Pt will demo ROM 0-120 deg for necessary functional range    Status  Achieved      PT LONG TERM GOAL #2   Title  Pt will be able to navigate stairs step over ste for proper patterns at home and in the community      PT [place] #3   Title  Pt will be able to demo SLS on Lt leg safely for 5s without UE support for necessary balance for daily ambulation    Baseline  5 sec unsteady and lacks confidence     Status  On-going      PT LONG TERM GOAL #4   Title  FOTO to 32% limited            Plan - 11/01/18 1126    Clinical Impression Statement  Pt continues to improve in functionl strength and mobility.  Integrating core into exercises as well.  She is hoping to be able to start the moving and  packing process soon which will require a lot of squatting bending and lifting .  FOTO score improved 11%.     PT Treatment/Interventions  ADLs/Self Care Home Management;Cryotherapy;Electrical Stimulation;Moist Heat;Gait training;Stair training;Functional mobility training;Therapeutic activities;Therapeutic exercise;Balance training;Patient/family education;Manual techniques;Scar mobilization;Passive range of motion;Vasopneumatic Device;Taping    PT Next Visit Plan  continue CKC, balance; 10th visit PN coming up next, FOTO is DONE     PT Home Exercise Plan  RICE 2x15 min daily, prop heel for extension; seated HSS; seated quad set + SLR; sit<>stand, heel raises, gastroc stretch standing    Consulted and Agree with Plan of Care  Patient       Patient will benefit from skilled therapeutic intervention in order to improve the following deficits and impairments:  Abnormal gait, Pain, Increased muscle spasms, Decreased activity tolerance, Decreased endurance, Decreased range of motion, Decreased strength, Impaired flexibility, Increased edema, Difficulty walking, Decreased balance  Visit Diagnosis: Acute pain of left knee  Stiffness of left knee, not elsewhere classified  Muscle weakness (generalized)  Localized edema     Problem List Patient Active Problem List   Diagnosis Date Noted  . Status post total left knee replacement 08/26/2018  . Unilateral primary osteoarthritis, left knee 08/15/2018  . Sprain of unspecified ligament of right ankle, initial encounter 06/29/2016  . Rectocele 06/10/2011  . Abnormal ECG 03/26/2011  . Dyspnea on exertion 03/26/2011    Renee Haynes 11/01/2018, 11:51 AM  [facility] [address] [place], [place], [address] Phone: [phone]   Fax:  [phone]  Name: Renee Haynes MRN: [id number] Date of Birth: 1951/05/24  Raeford Razor, PT 11/01/18 11:51 AM Phone: [phone] Fax: [phone]

## 2018-11-03 ENCOUNTER — Ambulatory Visit: Payer: PPO | Admitting: Physical Therapy

## 2018-11-03 ENCOUNTER — Other Ambulatory Visit: Payer: Self-pay

## 2018-11-03 ENCOUNTER — Ambulatory Visit: Payer: PPO | Attending: Orthopaedic Surgery | Admitting: Physical Therapy

## 2018-11-03 DIAGNOSIS — M25562 Pain in left knee: Secondary | ICD-10-CM | POA: Diagnosis not present

## 2018-11-03 DIAGNOSIS — R600 Localized edema: Secondary | ICD-10-CM | POA: Diagnosis not present

## 2018-11-03 DIAGNOSIS — M25662 Stiffness of left knee, not elsewhere classified: Secondary | ICD-10-CM | POA: Insufficient documentation

## 2018-11-03 DIAGNOSIS — M6281 Muscle weakness (generalized): Secondary | ICD-10-CM | POA: Diagnosis not present

## 2018-11-03 NOTE — Therapy (Signed)
[place], [place], [address] Phone: [phone]   Fax:  [phone]  Physical Therapy Treatment/Progress note Progress Note reporting period 09/27/18 to 11/03/18 See below for objective and subjective measurements relating to patients progress with PT.   Patient Details  Name: Renee Haynes MRN: [id number] Date of Birth: Dec 02, 1950 Referring Provider (PT): Jean Rosenthal, MD   Encounter Date: 11/03/2018  PT End of Session - 11/03/18 1442    Visit Number  10    Number of Visits  13    Date for PT Re-Evaluation  11/11/18    Authorization Type  Health Team Adv MCR   PN at visit 10    PT Start Time  1230    PT Stop Time  1315    PT Time Calculation (min)  45 min    Activity Tolerance  Patient tolerated treatment well    Behavior During Therapy  [facility] for tasks assessed/performed       Past Medical History:  Diagnosis Date  . Abnormal EKG    recent w/u and neg echo  . Cancer ([place])    BCC chest and Squamous cell on chest  . Dysrhythmia    right bundle blanch block  . GERD (gastroesophageal reflux disease)   . History of kidney stones   . Hypothyroidism    hypothyroidism  . Osteoarthritis of right knee   . Pneumonia     Past Surgical History:  Procedure Laterality Date  . ABDOMINAL HYSTERECTOMY  2006  . COLONOSCOPY    . KNEE ARTHROPLASTY Right   . POLYPECTOMY    . RECTOCELE REPAIR  06/10/2011   Procedure: POSTERIOR REPAIR (RECTOCELE);  Surgeon: Elveria Royals;  Location: [facility];  Service: Gynecology;  Laterality: N/A;  . TOTAL KNEE ARTHROPLASTY Right 2009  . TOTAL KNEE ARTHROPLASTY Left 08/26/2018   Procedure: LEFT TOTAL KNEE ARTHROPLASTY;  Surgeon: Mcarthur Rossetti, MD;  Location: [facility];  Service: Orthopedics;  Laterality: Left;  . UPPER GASTROINTESTINAL ENDOSCOPY      There were no vitals filed for this visit.  Subjective Assessment - 11/03/18 1255    Subjective  No pain just  needs better SLS ability and more strengthening. concerned about moving houses in a couple weeks    Currently in Pain?  No/denies         [facility] PT Assessment - 11/03/18 0001      Assessment   Medical Diagnosis  s/p Lt TKA    Referring Provider (PT)  Jean Rosenthal, MD    Onset Date/Surgical Date  08/26/18      PROM   Left Knee Extension  0    Left Knee Flexion  126                   [facility] Adult PT Treatment/Exercise - 11/03/18 0001      Knee/Hip Exercises: Stretches   Other Knee/Hip Stretches  heelslides AAROM supine with strap 10 sec X 10      Knee/Hip Exercises: Aerobic   Stationary Bike  5 min L3    Elliptical  3 min (2 min fwd, 1 min back) L1      Knee/Hip Exercises: Machines for Strengthening   Cybex Knee Extension  10 lbs 2X10    Cybex Knee Flexion  25 lbs X 20    Cybex Leg Press  45 lbs both legs X 20, then 25 lbs Lt leg X 20      Knee/Hip Exercises:  Standing   Forward Step Up  Left;15 reps;Hand Hold: 0;Step Height: 8"    Forward Step Up Limitations  witth opp leg march to facilitate more quad activation and SLS balance (close guarding with this and she was by countertop)    SLS  20 sec avg X 3 then progressed to SLS with opp hip flex/ext, and SLS on foam     Other Standing Knee Exercises  reviewed proper lifiting mechanics then demonstrated with 8# box floor to waist X 10, progressed to 15 total lbs X 10             PT Education - 11/03/18 4193    Education Details  proper lifting mechanics as she will be moving at the end of the month    Person(s) Educated  Patient    Methods  Explanation;Demonstration;Verbal cues    Comprehension  Verbalized understanding;Returned demonstration;Need further instruction          PT Long Term Goals - 11/03/18 1446      PT LONG TERM GOAL #1   Title  Pt will demo ROM 0-120 deg for necessary functional range    Baseline  0-125    Time  6    Period  Weeks    Status  Achieved      PT LONG TERM  GOAL #2   Title  Pt will be able to navigate stairs step over ste for proper patterns at home and in the community    Time  6    Period  Weeks    Status  Achieved      PT LONG TERM GOAL #3   Title  Pt will be able to demo SLS on Lt leg safely for 5s without UE support for necessary balance for daily ambulation    Baseline  20 sec average today    Time  6    Period  Weeks    Status  Achieved      PT LONG TERM GOAL #4   Title  FOTO to 32% limited    Baseline  36% limited last visit    Time  6    Period  Weeks    Status  On-going            Plan - 11/03/18 1443    Clinical Impression Statement  Pt is making great progress in all areas and has met all but one PT goal. She feels she is 80% back to normal function with PT, only missing strength and Single leg balance. Session today focused on continued functional knee strengthening and Single leg balance and she was progressed in functional lifitng from the floor with boxlift as she will begin packing in order to move in the next month.  She will continue to benefit from a little more PT for knee/hip strenth and stability.     Rehab Potential  Good    PT Frequency  2x / week    PT Duration  6 weeks    PT Treatment/Interventions  ADLs/Self Care Home Management;Cryotherapy;Electrical Stimulation;Moist Heat;Gait training;Stair training;Functional mobility training;Therapeutic activities;Therapeutic exercise;Balance training;Patient/family education;Manual techniques;Scar mobilization;Passive range of motion;Vasopneumatic Device;Taping    PT Next Visit Plan  continue CKC, balance; review functional box lift floor to waist    Consulted and Agree with Plan of Care  Patient       Patient will benefit from skilled therapeutic intervention in order to improve the following deficits and impairments:  Abnormal gait, Pain, Increased muscle spasms, Decreased activity  tolerance, Decreased endurance, Decreased range of motion, Decreased strength,  Impaired flexibility, Increased edema, Difficulty walking, Decreased balance  Visit Diagnosis: Acute pain of left knee  Stiffness of left knee, not elsewhere classified  Muscle weakness (generalized)  Localized edema     Problem List Patient Active Problem List   Diagnosis Date Noted  . Status post total left knee replacement 08/26/2018  . Unilateral primary osteoarthritis, left knee 08/15/2018  . Sprain of unspecified ligament of right ankle, initial encounter 06/29/2016  . Rectocele 06/10/2011  . Abnormal ECG 03/26/2011  . Dyspnea on exertion 03/26/2011    Silvestre Mesi 11/03/2018, 2:47 PM  [facility] [address] [place], [place], [address] Phone: [phone]   Fax:  [phone]  Name: Renee Haynes MRN: [id number] Date of Birth: April 05, 1951

## 2018-11-08 ENCOUNTER — Other Ambulatory Visit: Payer: Self-pay

## 2018-11-08 ENCOUNTER — Ambulatory Visit: Payer: PPO | Admitting: Physical Therapy

## 2018-11-08 ENCOUNTER — Encounter: Payer: Self-pay | Admitting: Physical Therapy

## 2018-11-08 DIAGNOSIS — R600 Localized edema: Secondary | ICD-10-CM

## 2018-11-08 DIAGNOSIS — M25562 Pain in left knee: Secondary | ICD-10-CM | POA: Diagnosis not present

## 2018-11-08 DIAGNOSIS — M6281 Muscle weakness (generalized): Secondary | ICD-10-CM

## 2018-11-08 DIAGNOSIS — M25662 Stiffness of left knee, not elsewhere classified: Secondary | ICD-10-CM

## 2018-11-08 NOTE — Therapy (Signed)
[place], [place], [address] Phone: [phone]   Fax:  [phone]  Physical Therapy Treatment  Patient Details  Name: Renee Haynes MRN: [id number] Date of Birth: [birth year] Referring Provider (PT): Jean Rosenthal, MD   Encounter Date: 11/08/2018  PT End of Session - 11/08/18 1151    Visit Number  11    Number of Visits  15    Date for PT Re-Evaluation  11/25/18    Authorization Type  Health Team Adv MCR   PN at visit 10    PT Start Time  1152    PT Stop Time  1233    PT Time Calculation (min)  41 min    Activity Tolerance  Patient tolerated treatment well    Behavior During Therapy  [facility] for tasks assessed/performed       Past Medical History:  Diagnosis Date  . Abnormal EKG    recent w/u and neg echo  . Cancer ([place])    BCC chest and Squamous cell on chest  . Dysrhythmia    right bundle blanch block  . GERD (gastroesophageal reflux disease)   . History of kidney stones   . Hypothyroidism    hypothyroidism  . Osteoarthritis of right knee   . Pneumonia     Past Surgical History:  Procedure Laterality Date  . ABDOMINAL HYSTERECTOMY  2006  . COLONOSCOPY    . KNEE ARTHROPLASTY Right   . POLYPECTOMY    . RECTOCELE REPAIR  06/10/2011   Procedure: POSTERIOR REPAIR (RECTOCELE);  Surgeon: Elveria Royals;  Location: [facility];  Service: Gynecology;  Laterality: N/A;  . TOTAL KNEE ARTHROPLASTY Right 2009  . TOTAL KNEE ARTHROPLASTY Left 08/26/2018   Procedure: LEFT TOTAL KNEE ARTHROPLASTY;  Surgeon: Mcarthur Rossetti, MD;  Location: [facility];  Service: Orthopedics;  Laterality: Left;  . UPPER GASTROINTESTINAL ENDOSCOPY      There were no vitals filed for this visit.  Subjective Assessment - 11/08/18 1152    Subjective  Soreness not pain, been working on single leg stance and it hard.      Currently in Pain?  No/denies    Pain Location  Knee    Pain Orientation  Left    Pain Type   Surgical pain    Pain Onset  More than a month ago    Pain Frequency  Intermittent         [facility] PT Assessment - 11/08/18 0001      Assessment   Medical Diagnosis  s/p Lt TKA    Referring Provider (PT)  Jean Rosenthal, MD    Onset Date/Surgical Date  08/26/18      Precautions   Precautions  None      [place] residence    Living Arrangements  Alone    Additional Comments  moving end of the month       Observation/Other Assessments   Focus on Therapeutic Outcomes (FOTO)   36%      PROM   Left Knee Extension  0    Left Knee Flexion  126      Strength   Right Hip Extension  4/5    Left Hip Extension  4/5    Left Hip ABduction  4+/5    Left Knee Flexion  5/5    Left Knee Extension  5/5          [facility] Adult PT Treatment/Exercise - 11/08/18  0001      Knee/Hip Exercises: Stretches   Active Hamstring Stretch  Left;3 reps;30 seconds    Knee: Self-Stretch to increase Flexion  Left;5 reps    Knee: Self-Stretch Limitations  supine with strap     ITB Stretch  Left;3 reps;30 seconds      Knee/Hip Exercises: Aerobic   Stationary Bike  5 min L3      Knee/Hip Exercises: Standing   Heel Raises  Both;1 set;10 reps    Forward Lunges Limitations  back lunge x 10 each    BOSU    Forward Step Up  Right;Left;1 set;15 reps    Forward Step Up Limitations  on BOSU with min HHA     Functional Squat  10 reps    Functional Squat Limitations  table side     Other Standing Knee Exercises  static L LE lunge on BOSU with Rt UE flexion, Lt.  UE abduction to challenge balance x 10 , then trunk rotation x 5 to each side       Knee/Hip Exercises: Sidelying   Hip ABduction  Strengthening;Left;1 set      Knee/Hip Exercises: Prone   Straight Leg Raises  Strengthening;Both;1 set;10 reps      Manual Therapy   Manual therapy comments  brief soft tissue to L knee in flexion , sore laterally              PT Education - 11/08/18 1157     Education Details  renewal    Person(s) Educated  Patient    Methods  Explanation    Comprehension  Verbalized understanding;Returned demonstration          PT Long Term Goals - 11/08/18 1157      PT LONG TERM GOAL #1   Title  Pt will demo ROM 0-120 deg for necessary functional range    Status  Achieved      PT LONG TERM GOAL #2   Title  Pt will be able to navigate stairs step over ste for proper patterns at home and in the community    Baseline  typical height is OK, higher are challenging     Status  Partially Met      PT LONG TERM GOAL #3   Title  Pt will be able to demo SLS on Lt leg safely for 5s without UE support for necessary balance for daily ambulation    Baseline  would like to improve     Status  Partially Met      PT LONG TERM GOAL #4   Title  FOTO to 32% limited    Baseline  36% limited last visit    Status  On-going            Plan - 11/08/18 1158    Clinical Impression Statement  Patient doing great overall, pain is minimal. She has good ROM 0-126 deg, but does lack muscle endurance, balance and hip/core strength.   She continues to need PT to improve her functional strength and stability.  She would like to cont with PT for 2 more weeks.     PT Frequency  2x / week    PT Duration  3 weeks    PT Treatment/Interventions  ADLs/Self Care Home Management;Cryotherapy;Electrical Stimulation;Moist Heat;Gait training;Stair training;Functional mobility training;Therapeutic activities;Therapeutic exercise;Balance training;Patient/family education;Manual techniques;Scar mobilization;Passive range of motion;Vasopneumatic Device;Taping    PT Next Visit Plan  continue CKC, balance; review functional box lift floor to waist    PT  Home Exercise Plan  RICE 2x15 min daily, prop heel for extension; seated HSS; seated quad set + SLR; sit<>stand, heel raises, gastroc stretch standing    Consulted and Agree with Plan of Care  Patient       Patient will benefit from skilled  therapeutic intervention in order to improve the following deficits and impairments:  Abnormal gait, Pain, Increased muscle spasms, Decreased activity tolerance, Decreased endurance, Decreased range of motion, Decreased strength, Impaired flexibility, Increased edema, Difficulty walking, Decreased balance  Visit Diagnosis: Acute pain of left knee  Stiffness of left knee, not elsewhere classified  Muscle weakness (generalized)  Localized edema     Problem List Patient Active Problem List   Diagnosis Date Noted  . Status post total left knee replacement 08/26/2018  . Unilateral primary osteoarthritis, left knee 08/15/2018  . Sprain of unspecified ligament of right ankle, initial encounter 06/29/2016  . Rectocele 06/10/2011  . Abnormal ECG 03/26/2011  . Dyspnea on exertion 03/26/2011    Kimberlyn Quiocho 11/08/2018, 12:42 PM  [facility] [address] [place], [place], [address] Phone: [phone]   Fax:  [phone]  Name: Jiya Kissinger MRN: [id number] Date of Birth: [birth year]  Raeford Razor, PT 11/08/18 12:42 PM Phone: [phone] Fax: [phone]

## 2018-11-09 ENCOUNTER — Ambulatory Visit: Payer: PPO

## 2018-11-14 ENCOUNTER — Encounter: Payer: Self-pay | Admitting: Physical Therapy

## 2018-11-14 ENCOUNTER — Ambulatory Visit: Payer: PPO | Admitting: Physical Therapy

## 2018-11-14 ENCOUNTER — Other Ambulatory Visit: Payer: Self-pay

## 2018-11-14 DIAGNOSIS — M25562 Pain in left knee: Secondary | ICD-10-CM | POA: Diagnosis not present

## 2018-11-14 DIAGNOSIS — M6281 Muscle weakness (generalized): Secondary | ICD-10-CM

## 2018-11-14 DIAGNOSIS — R600 Localized edema: Secondary | ICD-10-CM

## 2018-11-14 DIAGNOSIS — M25662 Stiffness of left knee, not elsewhere classified: Secondary | ICD-10-CM

## 2018-11-14 NOTE — Therapy (Signed)
[place], [place], [address] Phone: [phone]   Fax:  [phone]  Physical Therapy Treatment  Patient Details  Name: Renee Haynes MRN: [id number] Date of Birth: May 25, 1951 Referring Provider (PT): Jean Rosenthal, MD   Encounter Date: 11/14/2018  PT End of Session - 11/14/18 1154    Visit Number  12    Number of Visits  15    Date for PT Re-Evaluation  11/25/18    Authorization Type  Health Team Adv MCR   PN at visit 10    PT Start Time  1148    PT Stop Time  1229    PT Time Calculation (min)  41 min    Activity Tolerance  Patient tolerated treatment well    Behavior During Therapy  [facility] for tasks assessed/performed       Past Medical History:  Diagnosis Date  . Abnormal EKG    recent w/u and neg echo  . Cancer ([place])    BCC chest and Squamous cell on chest  . Dysrhythmia    right bundle blanch block  . GERD (gastroesophageal reflux disease)   . History of kidney stones   . Hypothyroidism    hypothyroidism  . Osteoarthritis of right knee   . Pneumonia     Past Surgical History:  Procedure Laterality Date  . ABDOMINAL HYSTERECTOMY  2006  . COLONOSCOPY    . KNEE ARTHROPLASTY Right   . POLYPECTOMY    . RECTOCELE REPAIR  06/10/2011   Procedure: POSTERIOR REPAIR (RECTOCELE);  Surgeon: Elveria Royals;  Location: [facility];  Service: Gynecology;  Laterality: N/A;  . TOTAL KNEE ARTHROPLASTY Right 2009  . TOTAL KNEE ARTHROPLASTY Left 08/26/2018   Procedure: LEFT TOTAL KNEE ARTHROPLASTY;  Surgeon: Mcarthur Rossetti, MD;  Location: [facility];  Service: Orthopedics;  Laterality: Left;  . UPPER GASTROINTESTINAL ENDOSCOPY      There were no vitals filed for this visit.  Subjective Assessment - 11/14/18 1155    Subjective  "some soreness, I have alot to do with moving"     Patient Stated Goals  be able to ride bike, bend    Currently in Pain?  No/denies    Aggravating Factors   walking,  stairs         [facility] PT Assessment - 11/14/18 0001      Assessment   Medical Diagnosis  s/p Lt TKA                   [facility] Adult PT Treatment/Exercise - 11/14/18 0001      Therapeutic Activites    Therapeutic Activities  Lifting    Lifting  lifting 12# and 22# box from floor<>waist 2 x 10, and stepping over to the shelf 2 x 5, pushing sled 4 x 30 ft with    demonstration for proper form     Knee/Hip Exercises: Stretches   Active Hamstring Stretch  2 reps;30 seconds;Left   seated   Gastroc Stretch  2 reps;30 seconds      Knee/Hip Exercises: Aerobic   Stationary Bike  L5 x 8 min LE only   working on endurance     Knee/Hip Exercises: Machines for Strengthening   Cybex Knee Extension  10 lbs 2X15    Cybex Knee Flexion  25 lbs 2 X 20      Knee/Hip Exercises: Standing   Forward Lunges Limitations  2 x 15 bil touching down on to [employer]  Balance Exercises - 11/14/18 1315      Balance Exercises: Standing   Standing Eyes Opened  Narrow base of support (BOS);1 rep;30 secs    Standing Eyes Closed  Narrow base of support (BOS);2 reps;30 secs   in //   Tandem Stance  Eyes open;Eyes closed;4 reps;30 secs   alternating lead LE, in //            PT Long Term Goals - 11/08/18 1157      PT LONG TERM GOAL #1   Title  Pt will demo ROM 0-120 deg for necessary functional range    Status  Achieved      PT LONG TERM GOAL #2   Title  Pt will be able to navigate stairs step over ste for proper patterns at home and in the community    Baseline  typical height is OK, higher are challenging     Status  Partially Met      PT LONG TERM GOAL #3   Title  Pt will be able to demo SLS on Lt leg safely for 5s without UE support for necessary balance for daily ambulation    Baseline  would like to improve     Status  Partially Met      PT LONG TERM GOAL #4   Title  FOTO to 32% limited    Baseline  36% limited last visit    Status  On-going             Plan - 11/14/18 1318    Clinical Impression Statement  pt continues to do well reporting no pain today and intermittent stiffness. Session focused on endurance and strengthening as well as balance which she demonstrate mod postural sway with tandem positioning. updated HEP for corner balance. pt performed all exercises well reporting no increase in symptoms.     PT Next Visit Plan  continue CKC, balance; review functional box lift floor to waist,     PT Home Exercise Plan  RICE 2x15 min daily, prop heel for extension; seated HSS; seated quad set + SLR; sit<>stand, heel raises, gastroc stretch standing, corner balance    Consulted and Agree with Plan of Care  Patient       Patient will benefit from skilled therapeutic intervention in order to improve the following deficits and impairments:  Abnormal gait, Pain, Increased muscle spasms, Decreased activity tolerance, Decreased endurance, Decreased range of motion, Decreased strength, Impaired flexibility, Increased edema, Difficulty walking, Decreased balance  Visit Diagnosis: Acute pain of left knee  Stiffness of left knee, not elsewhere classified  Muscle weakness (generalized)  Localized edema     Problem List Patient Active Problem List   Diagnosis Date Noted  . Status post total left knee replacement 08/26/2018  . Unilateral primary osteoarthritis, left knee 08/15/2018  . Sprain of unspecified ligament of right ankle, initial encounter 06/29/2016  . Rectocele 06/10/2011  . Abnormal ECG 03/26/2011  . Dyspnea on exertion 03/26/2011    Starr Lake PT, DPT, LAT, ATC  11/14/18  1:20 PM      [facility] [address] [place], [place], [address] Phone: [phone]   Fax:  [phone]  Name: Renee Haynes MRN: [id number] Date of Birth: 07-27-1951

## 2018-11-21 ENCOUNTER — Ambulatory Visit: Payer: PPO | Admitting: Physical Therapy

## 2018-11-21 ENCOUNTER — Encounter: Payer: Self-pay | Admitting: Physical Therapy

## 2018-11-21 ENCOUNTER — Other Ambulatory Visit: Payer: Self-pay

## 2018-11-21 DIAGNOSIS — M6281 Muscle weakness (generalized): Secondary | ICD-10-CM

## 2018-11-21 DIAGNOSIS — M25662 Stiffness of left knee, not elsewhere classified: Secondary | ICD-10-CM

## 2018-11-21 DIAGNOSIS — R600 Localized edema: Secondary | ICD-10-CM

## 2018-11-21 DIAGNOSIS — M25562 Pain in left knee: Secondary | ICD-10-CM

## 2018-11-21 NOTE — Therapy (Signed)
[place], [place], [address] Phone: [phone]   Fax:  [phone]  Physical Therapy Treatment / Discharge Summary  Patient Details  Name: Renee Haynes MRN: [id number] Date of Birth: [birth year] Referring Provider (PT): Jean Rosenthal, MD   Encounter Date: 11/21/2018  PT End of Session - 11/21/18 1150    Visit Number  13    Number of Visits  15    Date for PT Re-Evaluation  11/25/18    Authorization Type  Health Team Adv MCR   PN at visit 10    PT Start Time  1145    PT Stop Time  1223    PT Time Calculation (min)  38 min    Activity Tolerance  Patient tolerated treatment well    Behavior During Therapy  [facility] for tasks assessed/performed       Past Medical History:  Diagnosis Date  . Abnormal EKG    recent w/u and neg echo  . Cancer ([place])    BCC chest and Squamous cell on chest  . Dysrhythmia    right bundle blanch block  . GERD (gastroesophageal reflux disease)   . History of kidney stones   . Hypothyroidism    hypothyroidism  . Osteoarthritis of right knee   . Pneumonia     Past Surgical History:  Procedure Laterality Date  . ABDOMINAL HYSTERECTOMY  2006  . COLONOSCOPY    . KNEE ARTHROPLASTY Right   . POLYPECTOMY    . RECTOCELE REPAIR  06/10/2011   Procedure: POSTERIOR REPAIR (RECTOCELE);  Surgeon: Elveria Royals;  Location: [facility];  Service: Gynecology;  Laterality: N/A;  . TOTAL KNEE ARTHROPLASTY Right 2009  . TOTAL KNEE ARTHROPLASTY Left 08/26/2018   Procedure: LEFT TOTAL KNEE ARTHROPLASTY;  Surgeon: Mcarthur Rossetti, MD;  Location: [facility];  Service: Orthopedics;  Laterality: Left;  . UPPER GASTROINTESTINAL ENDOSCOPY      There were no vitals filed for this visit.  Subjective Assessment - 11/21/18 1149    Subjective  " I was sore after the last visit but it was definitley good, today I am doing well and I think today is my last day"     Patient Stated Goals  be  able to ride bike, bend    Currently in Pain?  No/denies    Aggravating Factors   N/A         [facility] PT Assessment - 11/21/18 1156      Observation/Other Assessments   Focus on Therapeutic Outcomes (FOTO)   1% limited      PROM   Left Knee Extension  2    Left Knee Flexion  131                   [facility] Adult PT Treatment/Exercise - 11/21/18 0001      Self-Care   Self-Care  Posture    Posture  proper posture and spinal positioning and associated handout      Knee/Hip Exercises: Stretches   Active Hamstring Stretch  2 reps;30 seconds      Knee/Hip Exercises: Standing   Hip Abduction  2 sets;10 reps;Knee straight   with red theraband   Hip Extension  2 sets;Knee straight;Stengthening   with red theraband     Knee/Hip Exercises: Seated   Sit to Sand  1 set;10 reps             PT Education - 11/21/18 1212    Education  Details  reviewed previously provided HEP and updated. educated on posture and lifting mechanics. importance of continued strengthening and how to progress resistance/ endurance to promote overall functioning    Person(s) Educated  Patient    Methods  Explanation;Verbal cues;Handout    Comprehension  Verbalized understanding;Verbal cues required          PT Long Term Goals - 11/21/18 1202      PT LONG TERM GOAL #1   Title  Pt will demo ROM 0-120 deg for necessary functional range    Period  Weeks    Status  Achieved      PT LONG TERM GOAL #2   Title  Pt will be able to navigate stairs step over ste for proper patterns at home and in the community    Time  6    Period  Weeks    Status  Achieved      PT LONG TERM GOAL #3   Title  Pt will be able to demo SLS on Lt leg safely for 5s without UE support for necessary balance for daily ambulation    Period  Weeks    Status  Achieved      PT LONG TERM GOAL #4   Title  FOTO to 32% limited    Time  6    Period  Weeks    Status  Achieved            Plan - 11/21/18 1222     Clinical Impression Statement  Renee Haynes has made great progress with physical therapy increasing ROM and strength and additionally reports no pain. She was able to do all exercises today with no report of issue or pain. she met all goals and is able to maintain and progress her current level of function independnently and will be discharged from PT today.     PT Next Visit Plan  d/C    PT Home Exercise Plan  RICE 2x15 min daily, prop heel for extension; seated HSS; seated quad set + SLR; sit<>stand, heel raises, gastroc stretch standing, corner balance, hp abduction/ extension with resistance, sit to stand, posture education    Consulted and Agree with Plan of Care  Patient       Patient will benefit from skilled therapeutic intervention in order to improve the following deficits and impairments:  Abnormal gait, Pain, Increased muscle spasms, Decreased activity tolerance, Decreased endurance, Decreased range of motion, Decreased strength, Impaired flexibility, Increased edema, Difficulty walking, Decreased balance  Visit Diagnosis: Acute pain of left knee  Stiffness of left knee, not elsewhere classified  Muscle weakness (generalized)  Localized edema     Problem List Patient Active Problem List   Diagnosis Date Noted  . Status post total left knee replacement 08/26/2018  . Unilateral primary osteoarthritis, left knee 08/15/2018  . Sprain of unspecified ligament of right ankle, initial encounter 06/29/2016  . Rectocele 06/10/2011  . Abnormal ECG 03/26/2011  . Dyspnea on exertion 03/26/2011   Starr Lake PT, DPT, LAT, ATC  11/21/18  12:24 PM      [facility] [address] [place], [place], [address] Phone: [phone]   Fax:  [phone]  Name: Renee Haynes MRN: [id number] Date of Birth: [birth year]           PHYSICAL THERAPY DISCHARGE SUMMARY  Visits from Start of Care: 13  Current functional level  related to goals / functional outcomes: See goals, FOTO 1% limited   Remaining  deficits: N/A   Education / Equipment: HEP, posture, theraband, RICE,   Plan: Patient agrees to discharge.  Patient goals were met. Patient is being discharged due to meeting the stated rehab goals.  ?????         [name] PT, DPT, LAT, ATC  11/21/18  12:25 PM

## 2018-11-21 NOTE — Patient Instructions (Signed)

## 2019-01-05 DIAGNOSIS — M859 Disorder of bone density and structure, unspecified: Secondary | ICD-10-CM | POA: Diagnosis not present

## 2019-01-05 DIAGNOSIS — E7849 Other hyperlipidemia: Secondary | ICD-10-CM | POA: Diagnosis not present

## 2019-01-05 DIAGNOSIS — E038 Other specified hypothyroidism: Secondary | ICD-10-CM | POA: Diagnosis not present

## 2019-01-18 DIAGNOSIS — R82998 Other abnormal findings in urine: Secondary | ICD-10-CM | POA: Diagnosis not present

## 2019-01-24 DIAGNOSIS — M1990 Unspecified osteoarthritis, unspecified site: Secondary | ICD-10-CM | POA: Diagnosis not present

## 2019-01-24 DIAGNOSIS — E785 Hyperlipidemia, unspecified: Secondary | ICD-10-CM | POA: Diagnosis not present

## 2019-01-24 DIAGNOSIS — E039 Hypothyroidism, unspecified: Secondary | ICD-10-CM | POA: Diagnosis not present

## 2019-01-24 DIAGNOSIS — G4700 Insomnia, unspecified: Secondary | ICD-10-CM | POA: Diagnosis not present

## 2019-01-24 DIAGNOSIS — G459 Transient cerebral ischemic attack, unspecified: Secondary | ICD-10-CM | POA: Diagnosis not present

## 2019-01-24 DIAGNOSIS — F329 Major depressive disorder, single episode, unspecified: Secondary | ICD-10-CM | POA: Diagnosis not present

## 2019-01-24 DIAGNOSIS — K219 Gastro-esophageal reflux disease without esophagitis: Secondary | ICD-10-CM | POA: Diagnosis not present

## 2019-01-24 DIAGNOSIS — K635 Polyp of colon: Secondary | ICD-10-CM | POA: Diagnosis not present

## 2019-01-24 DIAGNOSIS — Z0000 Encounter for general adult medical examination without abnormal findings: Secondary | ICD-10-CM | POA: Diagnosis not present

## 2019-01-24 DIAGNOSIS — M8580 Other specified disorders of bone density and structure, unspecified site: Secondary | ICD-10-CM | POA: Diagnosis not present

## 2019-02-15 ENCOUNTER — Ambulatory Visit: Payer: Self-pay | Admitting: Orthopaedic Surgery

## 2019-02-24 DIAGNOSIS — L820 Inflamed seborrheic keratosis: Secondary | ICD-10-CM | POA: Diagnosis not present

## 2019-02-24 DIAGNOSIS — I788 Other diseases of capillaries: Secondary | ICD-10-CM | POA: Diagnosis not present

## 2019-02-24 DIAGNOSIS — L304 Erythema intertrigo: Secondary | ICD-10-CM | POA: Diagnosis not present

## 2019-02-24 DIAGNOSIS — L821 Other seborrheic keratosis: Secondary | ICD-10-CM | POA: Diagnosis not present

## 2019-02-24 DIAGNOSIS — D485 Neoplasm of uncertain behavior of skin: Secondary | ICD-10-CM | POA: Diagnosis not present

## 2019-02-24 DIAGNOSIS — L720 Epidermal cyst: Secondary | ICD-10-CM | POA: Diagnosis not present

## 2019-02-24 DIAGNOSIS — Z85828 Personal history of other malignant neoplasm of skin: Secondary | ICD-10-CM | POA: Diagnosis not present

## 2019-02-24 DIAGNOSIS — L570 Actinic keratosis: Secondary | ICD-10-CM | POA: Diagnosis not present

## 2019-02-24 DIAGNOSIS — B078 Other viral warts: Secondary | ICD-10-CM | POA: Diagnosis not present

## 2019-05-19 DIAGNOSIS — Z85828 Personal history of other malignant neoplasm of skin: Secondary | ICD-10-CM | POA: Diagnosis not present

## 2019-05-19 DIAGNOSIS — L821 Other seborrheic keratosis: Secondary | ICD-10-CM | POA: Diagnosis not present

## 2019-06-05 ENCOUNTER — Other Ambulatory Visit: Payer: Self-pay

## 2019-06-05 DIAGNOSIS — Z20822 Contact with and (suspected) exposure to covid-19: Secondary | ICD-10-CM

## 2019-06-06 LAB — NOVEL CORONAVIRUS, NAA: SARS-CoV-2, NAA: NOT DETECTED

## 2019-08-14 DIAGNOSIS — Z1231 Encounter for screening mammogram for malignant neoplasm of breast: Secondary | ICD-10-CM | POA: Diagnosis not present

## 2019-08-14 DIAGNOSIS — N8111 Cystocele, midline: Secondary | ICD-10-CM | POA: Diagnosis not present

## 2019-08-14 DIAGNOSIS — R21 Rash and other nonspecific skin eruption: Secondary | ICD-10-CM | POA: Diagnosis not present

## 2019-08-14 DIAGNOSIS — R350 Frequency of micturition: Secondary | ICD-10-CM | POA: Diagnosis not present

## 2019-08-14 DIAGNOSIS — Z124 Encounter for screening for malignant neoplasm of cervix: Secondary | ICD-10-CM | POA: Diagnosis not present

## 2019-08-14 DIAGNOSIS — L292 Pruritus vulvae: Secondary | ICD-10-CM | POA: Diagnosis not present

## 2019-08-17 ENCOUNTER — Other Ambulatory Visit (HOSPITAL_COMMUNITY): Payer: Self-pay | Admitting: *Deleted

## 2019-08-17 DIAGNOSIS — R0609 Other forms of dyspnea: Secondary | ICD-10-CM

## 2019-08-17 DIAGNOSIS — R9431 Abnormal electrocardiogram [ECG] [EKG]: Secondary | ICD-10-CM

## 2019-08-21 DIAGNOSIS — E785 Hyperlipidemia, unspecified: Secondary | ICD-10-CM | POA: Diagnosis not present

## 2019-08-21 DIAGNOSIS — R079 Chest pain, unspecified: Secondary | ICD-10-CM | POA: Diagnosis not present

## 2019-08-21 DIAGNOSIS — E039 Hypothyroidism, unspecified: Secondary | ICD-10-CM | POA: Diagnosis not present

## 2019-08-24 ENCOUNTER — Ambulatory Visit: Payer: Self-pay | Admitting: Cardiology

## 2019-08-25 ENCOUNTER — Ambulatory Visit: Payer: PPO | Attending: Internal Medicine

## 2019-08-25 DIAGNOSIS — Z23 Encounter for immunization: Secondary | ICD-10-CM | POA: Insufficient documentation

## 2019-08-25 NOTE — Progress Notes (Signed)
   Covid-19 Vaccination Clinic  Name:  Oktober Glueckert    MRN: [id number] DOB: [birth year]  08/25/2019  Ms. Accardi was observed post Covid-19 immunization for 15 minutes without incidence. She was provided with Vaccine Information Sheet and instruction to access the V-Safe system.   Ms. Marsiglia was instructed to call 911 with any severe reactions post vaccine: Marland Kitchen Difficulty breathing  . Swelling of your face and throat  . A fast heartbeat  . A bad rash all over your body  . Dizziness and weakness    Immunizations Administered    Name Date Dose VIS Date Route   Pfizer COVID-19 Vaccine 08/25/2019  9:26 AM 0.3 mL 07/14/2019 Intramuscular   Manufacturer: [place]   Lot: [id number]   [place]: [id number]

## 2019-08-30 ENCOUNTER — Telehealth: Payer: Self-pay

## 2019-08-30 NOTE — Telephone Encounter (Signed)
Patient had episode of chest heaviness this morning but it has subsided now. No chest pain. She is scheduled to come in on 09/01/19. Patient asking if she should have sooner appointment? Please advise.

## 2019-08-30 NOTE — Telephone Encounter (Signed)
She is new to [place]. She should be seen sooner if possible. Put under [place] schedule if she has a spot or under me or MP

## 2019-08-31 NOTE — Telephone Encounter (Signed)
Patient is coming in tomorrow under jg

## 2019-08-31 NOTE — Telephone Encounter (Signed)
Please schedule

## 2019-09-01 ENCOUNTER — Encounter: Payer: Self-pay | Admitting: Cardiology

## 2019-09-01 ENCOUNTER — Ambulatory Visit (INDEPENDENT_AMBULATORY_CARE_PROVIDER_SITE_OTHER): Payer: PPO | Admitting: Cardiology

## 2019-09-01 ENCOUNTER — Other Ambulatory Visit: Payer: Self-pay

## 2019-09-01 ENCOUNTER — Ambulatory Visit: Payer: Self-pay | Admitting: Cardiology

## 2019-09-01 VITALS — BP 116/79 | HR 63 | Temp 97.9°F | Ht 65.0 in | Wt 172.1 lb

## 2019-09-01 DIAGNOSIS — R079 Chest pain, unspecified: Secondary | ICD-10-CM

## 2019-09-01 DIAGNOSIS — R9431 Abnormal electrocardiogram [ECG] [EKG]: Secondary | ICD-10-CM

## 2019-09-01 DIAGNOSIS — E785 Hyperlipidemia, unspecified: Secondary | ICD-10-CM

## 2019-09-01 DIAGNOSIS — R0789 Other chest pain: Secondary | ICD-10-CM

## 2019-09-01 NOTE — Progress Notes (Addendum)
Primary Physician:  Prince Solian, MD   Patient ID: Renee Haynes, female    DOB: [birth year], 69 y.o.   MRN: [id number]  Subjective:    Chief Complaint  Patient presents with  . New Patient (Initial Visit)  . Chest Pain    HPI: Renee Haynes  is a 69 y.o. female  with mild hyperlipidemia, skin cancer in the past, hypothyroidism, referred to [place] by Dr. Dagmar Hait for chest pain.  Patient reports that she has recently had 2 episodes of chest pain. First episode occurred while driving, the second while lying in bed. Symptoms are brief. No associated shortness of breath or arm radiation. She has known RBBB. Has previously been evaluated by Dr. Aundra Dubin originally in 2012 and had negative nuclear stress and echo in 2015/2016.  No history of hypertension. Has had some mild hyperlipidemia in the past, but is not currently on therapy.  She has previously done some light running, but now only walking once a week. No family history of heart disease.   Past Medical History:  Diagnosis Date  . Abnormal EKG    recent w/u and neg echo  . Cancer ([place])    BCC chest and Squamous cell on chest  . Dysrhythmia    right bundle blanch block  . GERD (gastroesophageal reflux disease)   . History of kidney stones   . Hypothyroidism    hypothyroidism  . Osteoarthritis of right knee   . Pneumonia   . RBBB     Past Surgical History:  Procedure Laterality Date  . ABDOMINAL HYSTERECTOMY  2006  . COLONOSCOPY    . KNEE ARTHROPLASTY Right   . POLYPECTOMY    . RECTOCELE REPAIR  06/10/2011   Procedure: POSTERIOR REPAIR (RECTOCELE);  Surgeon: Elveria Royals;  Location: [facility];  Service: Gynecology;  Laterality: N/A;  . TOTAL KNEE ARTHROPLASTY Right 2009  . TOTAL KNEE ARTHROPLASTY Left 08/26/2018   Procedure: LEFT TOTAL KNEE ARTHROPLASTY;  Surgeon: Mcarthur Rossetti, MD;  Location: [facility];  Service: Orthopedics;  Laterality: Left;  . UPPER GASTROINTESTINAL ENDOSCOPY      Social History    Socioeconomic History  . Marital status: Divorced    Spouse name: Not on file  . Number of children: 2  . Years of education: Not on file  . Highest education level: Not on file  Occupational History  . Not on file  Tobacco Use  . Smoking status: Never Smoker  . Smokeless tobacco: Never Used  Substance and Sexual Activity  . Alcohol use: Yes    Alcohol/week: 2.0 standard drinks    Types: 2 Glasses of wine per week    Comment: occasional  . Drug use: No  . Sexual activity: Not on file  Other Topics Concern  . Not on file  Social History Narrative  . Not on file   Social Determinants of Health   Financial Resource Strain:   . Difficulty of Paying Living Expenses: Not on file  Food Insecurity:   . Worried About Charity fundraiser in the Last Year: Not on file  . Ran Out of Food in the Last Year: Not on file  Transportation Needs:   . Lack of Transportation (Medical): Not on file  . Lack of Transportation (Non-Medical): Not on file  Physical Activity:   . Days of Exercise per Week: Not on file  . Minutes of Exercise per Session: Not on file  Stress:   . Feeling of Stress : Not  on file  Social Connections:   . Frequency of Communication with Friends and Family: Not on file  . Frequency of Social Gatherings with Friends and Family: Not on file  . Attends Religious Services: Not on file  . Active Member of Clubs or Organizations: Not on file  . Attends Archivist Meetings: Not on file  . Marital Status: Not on file  Intimate Partner Violence:   . Fear of Current or Ex-Partner: Not on file  . Emotionally Abused: Not on file  . Physically Abused: Not on file  . Sexually Abused: Not on file    Review of Systems  Constitution: Negative for decreased appetite, malaise/fatigue, weight gain and weight loss.  Eyes: Negative for visual disturbance.  Cardiovascular: Positive for chest pain. Negative for claudication, dyspnea on exertion, leg swelling, orthopnea,  palpitations and syncope.  Respiratory: Negative for hemoptysis and wheezing.   Endocrine: Negative for cold intolerance and heat intolerance.  Hematologic/Lymphatic: Does not bruise/bleed easily.  Skin: Negative for nail changes.  Musculoskeletal: Negative for muscle weakness and myalgias.  Gastrointestinal: Negative for abdominal pain, change in bowel habit, nausea and vomiting.  Neurological: Negative for difficulty with concentration, dizziness, focal weakness and headaches.  Psychiatric/Behavioral: Negative for altered mental status and suicidal ideas.  All other systems reviewed and are negative.     Objective:  Blood pressure 116/79, pulse 63, temperature 97.9 F (36.6 C), height 5\' 5"  (1.651 m), weight 172 lb 1.6 oz (78.1 kg), SpO2 98 %. Body mass index is 28.64 kg/m.    Physical Exam  Constitutional: She is oriented to person, place, and time. Vital signs are normal. She appears well-developed and well-nourished.  HENT:  Head: Normocephalic and atraumatic.  Cardiovascular: Normal rate, regular rhythm, normal heart sounds and intact distal pulses.  Pulmonary/Chest: Effort normal and breath sounds normal. No accessory muscle usage. No respiratory distress.  Abdominal: Soft. Bowel sounds are normal.  Musculoskeletal:        General: Normal range of motion.     Cervical back: Normal range of motion.  Neurological: She is alert and oriented to person, place, and time.  Skin: Skin is warm and dry.  Vitals reviewed.  Radiology: No results found.  Laboratory examination:   Outside labs 01/05/2019: Total cholesterol 199, triglycerides 55, HDL 72, LDL 116. Apo B 78. TSH normal.  CMP Latest Ref Rng & Units 08/27/2018 08/16/2018 12/29/2015  Glucose 70 - 99 mg/dL 114(H) 92 105(H)  BUN 8 - 23 mg/dL 13 18 18   Creatinine 0.44 - 1.00 mg/dL 0.71 0.67 0.80  Sodium 135 - 145 mmol/L 139 141 141  Potassium 3.5 - 5.1 mmol/L 4.0 4.4 3.8  Chloride 98 - 111 mmol/L 107 109 106  CO2 22 - 32  mmol/L 23 25 -  Calcium 8.9 - 10.3 mg/dL 8.8(L) 9.5 -  Total Protein 6.5 - 8.1 g/dL - - -  Total Bilirubin 0.3 - 1.2 mg/dL - - -  Alkaline Phos 38 - 126 U/L - - -  AST 15 - 41 U/L - - -  ALT 14 - 54 U/L - - -   CBC Latest Ref Rng & Units 08/27/2018 08/16/2018 12/29/2015  WBC 4.0 - 10.5 K/uL 7.0 4.7 -  Hemoglobin 12.0 - 15.0 g/dL 12.5 14.3 14.6  Hematocrit 36.0 - 46.0 % 38.4 44.0 43.0  Platelets 150 - 400 K/uL 150 174 -   Lipid Panel  No results found for: CHOL, TRIG, HDL, CHOLHDL, VLDL, LDLCALC, LDLDIRECT HEMOGLOBIN A1C Lab Results  Component Value Date   HGBA1C  02/18/2008    5.6 (NOTE)   The ADA recommends the following therapeutic goal for glycemic   control related to Hgb A1C measurement:   Goal of Therapy:   < 7.0% Hgb A1C   Reference: American Diabetes Association: Clinical Practice   Recommendations 2008, Diabetes Care,  2008, 31:(Suppl 1).   MPG 122 02/18/2008   TSH No results for input(s): TSH in the last 8760 hours.  PRN Meds:. Medications Discontinued During This Encounter  Medication Reason  . methocarbamol (ROBAXIN) 500 MG tablet Completed Course  . oxyCODONE (OXY IR/ROXICODONE) 5 MG immediate release tablet Completed Course  . aspirin EC 325 MG EC tablet Change in therapy  . CALCIUM PO Discontinued by provider   Current Meds  Medication Sig  . aspirin EC 81 MG tablet Take 81 mg by mouth daily.  . cholecalciferol (VITAMIN D3) 25 MCG (1000 UT) tablet Take 1,000 Units by mouth See admin instructions. Take 1000 units by mouth 5 times weekly  . levothyroxine (SYNTHROID, LEVOTHROID) 88 MCG tablet Take 88 mcg by mouth daily before breakfast.  . Multiple Vitamin (MULTIVITAMIN PO) Take 1 tablet by mouth 3 (three) times a week.   Marland Kitchen omeprazole (PRILOSEC OTC) 20 MG tablet Take 20 mg by mouth daily as needed (acid reflux).  . triamcinolone ointment (KENALOG) 0.1 % as needed.  . zolpidem (AMBIEN) 10 MG tablet Take 5 mg by mouth at bedtime as needed for sleep.    Current  Facility-Administered Medications for the 09/01/19 encounter (Office Visit) with Miquel Dunn, NP  Medication  . 0.9 %  sodium chloride infusion    Cardiac Studies:   none  Assessment:   Atypical chest pain - Plan: EKG 12-Lead  Mild hyperlipidemia  Abnormal EKG  EKG 09/01/2019: Sinus bradycardia at 59 bpm, normal axis, IRBBB. T wave inversionin anteroseptal leads, cannot exclude ischemia. No changes compared to EKG in 2016.   Recommendations:   Patient is referred to [place] for evaluation of chest pain.  Her chest pain is atypical and suggestive of musculoskeletal etiology.  She has minimal risk factors for CAD, except for some mild hyperlipidemia.  I would recommend cardiac calcium score test to further risk stratify her.  Depending upon these results we will decide if she needs stress testing and also depending upon her symptoms.  No history of hypertension.  She does have abnormal EKG with known right bundle branch block, that appears to be unchanged compared to previous EKGs in 2017 in 2016.  Will recommend repeating echocardiogram given her chest pain and abnormal EKG.  Encouraged her to use heat and ice to her chest wall as well as using some Tylenol/ibuprofen to see if chest pain will also improve.  Physical exam is unremarkable.  We will plan to see her back after her test in approximately 4 weeks for follow-up, but encouraged her to contact me sooner if needed.   *I have discussed this case with Dr. Einar Gip and he personally examined the patient and participated in formulating the plan.*   Miquel Dunn, MSN, APRN, FNP-C [facility]. [place] Office: [phone] Fax: [phone]

## 2019-09-05 ENCOUNTER — Ambulatory Visit (INDEPENDENT_AMBULATORY_CARE_PROVIDER_SITE_OTHER): Payer: PPO

## 2019-09-05 ENCOUNTER — Other Ambulatory Visit: Payer: Self-pay

## 2019-09-05 DIAGNOSIS — R0789 Other chest pain: Secondary | ICD-10-CM | POA: Diagnosis not present

## 2019-09-14 ENCOUNTER — Ambulatory Visit: Payer: PPO | Attending: Internal Medicine

## 2019-09-14 DIAGNOSIS — Z23 Encounter for immunization: Secondary | ICD-10-CM

## 2019-09-14 NOTE — Progress Notes (Signed)
   Covid-19 Vaccination Clinic  Name:  Renee Haynes    MRN: [id number] DOB: January 06, 1951  09/14/2019  [name] was observed post Covid-19 immunization for 15 minutes without incidence. She was provided with Vaccine Information Sheet and instruction to access the V-Safe system.   [name] was instructed to call 911 with any severe reactions post vaccine: Marland Kitchen Difficulty breathing  . Swelling of your face and throat  . A fast heartbeat  . A bad rash all over your body  . Dizziness and weakness    Immunizations Administered    Name Date Dose VIS Date Route   Pfizer COVID-19 Vaccine 09/14/2019  2:25 PM 0.3 mL 07/14/2019 Intramuscular   Manufacturer: [place]   Lot: [id number]   [place]: [id number]

## 2019-09-29 ENCOUNTER — Encounter: Payer: Self-pay | Admitting: Cardiology

## 2019-09-29 ENCOUNTER — Other Ambulatory Visit: Payer: Self-pay

## 2019-09-29 ENCOUNTER — Ambulatory Visit: Payer: PPO | Admitting: Cardiology

## 2019-09-29 VITALS — BP 107/68 | HR 71 | Temp 97.9°F | Ht 65.0 in | Wt 178.0 lb

## 2019-09-29 DIAGNOSIS — R9431 Abnormal electrocardiogram [ECG] [EKG]: Secondary | ICD-10-CM | POA: Diagnosis not present

## 2019-09-29 DIAGNOSIS — E785 Hyperlipidemia, unspecified: Secondary | ICD-10-CM | POA: Diagnosis not present

## 2019-09-29 DIAGNOSIS — R0789 Other chest pain: Secondary | ICD-10-CM | POA: Diagnosis not present

## 2019-09-29 NOTE — Progress Notes (Signed)
Primary Physician:  Prince Solian, MD   Patient ID: Renee Haynes, female    DOB: 07-26-1951, 69 y.o.   MRN: [id number]  Subjective:    Chief Complaint  Patient presents with  . Chest Pain  . Follow-up    4 week  . Results    echo and CT    HPI: Renee Haynes  is a 69 y.o. female  with mild hyperlipidemia, skin cancer in the past, hypothyroidism, known right bundle branch block since 2016, recently evaluated for atypical chest pain.  She underwent calcium score CT and echocardiogram and now presents for follow-up.  Since last seen by [place], she has not had any further chest pain.  She is overall feeling well.  No history of hypertension. Has had some mild hyperlipidemia in the past, but is not currently on therapy.  Working to control with diet.  She has previously done some light running, but now only walking once a week. No family history of heart disease.   Past Medical History:  Diagnosis Date  . Abnormal EKG    recent w/u and neg echo  . Cancer ([place])    BCC chest and Squamous cell on chest  . Dysrhythmia    right bundle blanch block  . GERD (gastroesophageal reflux disease)   . History of kidney stones   . Hypothyroidism    hypothyroidism  . Osteoarthritis of right knee   . Pneumonia   . RBBB     Past Surgical History:  Procedure Laterality Date  . ABDOMINAL HYSTERECTOMY  2006  . COLONOSCOPY    . KNEE ARTHROPLASTY Right   . POLYPECTOMY    . RECTOCELE REPAIR  06/10/2011   Procedure: POSTERIOR REPAIR (RECTOCELE);  Surgeon: Elveria Royals;  Location: [facility];  Service: Gynecology;  Laterality: N/A;  . TOTAL KNEE ARTHROPLASTY Right 2009  . TOTAL KNEE ARTHROPLASTY Left 08/26/2018   Procedure: LEFT TOTAL KNEE ARTHROPLASTY;  Surgeon: Mcarthur Rossetti, MD;  Location: [facility];  Service: Orthopedics;  Laterality: Left;  . UPPER GASTROINTESTINAL ENDOSCOPY      Social History   Socioeconomic History  . Marital status: Divorced    Spouse name: Not on  file  . Number of children: 2  . Years of education: Not on file  . Highest education level: Not on file  Occupational History  . Not on file  Tobacco Use  . Smoking status: Never Smoker  . Smokeless tobacco: Never Used  Substance and Sexual Activity  . Alcohol use: Yes    Alcohol/week: 2.0 standard drinks    Types: 2 Glasses of wine per week    Comment: occasional  . Drug use: No  . Sexual activity: Not on file  Other Topics Concern  . Not on file  Social History Narrative  . Not on file   Social Determinants of Health   Financial Resource Strain:   . Difficulty of Paying Living Expenses: Not on file  Food Insecurity:   . Worried About Charity fundraiser in the Last Year: Not on file  . Ran Out of Food in the Last Year: Not on file  Transportation Needs:   . Lack of Transportation (Medical): Not on file  . Lack of Transportation (Non-Medical): Not on file  Physical Activity:   . Days of Exercise per Week: Not on file  . Minutes of Exercise per Session: Not on file  Stress:   . Feeling of Stress : Not on file  Social  Connections:   . Frequency of Communication with Friends and Family: Not on file  . Frequency of Social Gatherings with Friends and Family: Not on file  . Attends Religious Services: Not on file  . Active Member of Clubs or Organizations: Not on file  . Attends Archivist Meetings: Not on file  . Marital Status: Not on file  Intimate Partner Violence:   . Fear of Current or Ex-Partner: Not on file  . Emotionally Abused: Not on file  . Physically Abused: Not on file  . Sexually Abused: Not on file    Review of Systems  Constitution: Negative for decreased appetite, malaise/fatigue, weight gain and weight loss.  Eyes: Negative for visual disturbance.  Cardiovascular: Negative for chest pain, claudication, dyspnea on exertion, leg swelling, orthopnea, palpitations and syncope.  Respiratory: Negative for hemoptysis and wheezing.   Endocrine:  Negative for cold intolerance and heat intolerance.  Hematologic/Lymphatic: Does not bruise/bleed easily.  Skin: Negative for nail changes.  Musculoskeletal: Negative for muscle weakness and myalgias.  Gastrointestinal: Negative for abdominal pain, change in bowel habit, nausea and vomiting.  Neurological: Negative for difficulty with concentration, dizziness, focal weakness and headaches.  Psychiatric/Behavioral: Negative for altered mental status and suicidal ideas.  All other systems reviewed and are negative.     Objective:  Blood pressure 107/68, pulse 71, temperature 97.9 F (36.6 C), height 5\' 5"  (1.651 m), weight 178 lb (80.7 kg), SpO2 96 %. Body mass index is 29.62 kg/m.    Physical Exam  Constitutional: She is oriented to person, place, and time. Vital signs are normal. She appears well-developed and well-nourished.  HENT:  Head: Normocephalic and atraumatic.  Cardiovascular: Normal rate, regular rhythm, normal heart sounds and intact distal pulses.  Pulmonary/Chest: Effort normal and breath sounds normal. No accessory muscle usage. No respiratory distress.  Abdominal: Soft. Bowel sounds are normal.  Musculoskeletal:        General: Normal range of motion.     Cervical back: Normal range of motion.  Neurological: She is alert and oriented to person, place, and time.  Skin: Skin is warm and dry.  Vitals reviewed.  Radiology: No results found.  Laboratory examination:   Outside labs 01/05/2019: Total cholesterol 199, triglycerides 55, HDL 72, LDL 116. Apo B 78. TSH normal.  CMP Latest Ref Rng & Units 08/27/2018 08/16/2018 12/29/2015  Glucose 70 - 99 mg/dL 114(H) 92 105(H)  BUN 8 - 23 mg/dL 13 18 18   Creatinine 0.44 - 1.00 mg/dL 0.71 0.67 0.80  Sodium 135 - 145 mmol/L 139 141 141  Potassium 3.5 - 5.1 mmol/L 4.0 4.4 3.8  Chloride 98 - 111 mmol/L 107 109 106  CO2 22 - 32 mmol/L 23 25 -  Calcium 8.9 - 10.3 mg/dL 8.8(L) 9.5 -  Total Protein 6.5 - 8.1 g/dL - - -  Total  Bilirubin 0.3 - 1.2 mg/dL - - -  Alkaline Phos 38 - 126 U/L - - -  AST 15 - 41 U/L - - -  ALT 14 - 54 U/L - - -   CBC Latest Ref Rng & Units 08/27/2018 08/16/2018 12/29/2015  WBC 4.0 - 10.5 K/uL 7.0 4.7 -  Hemoglobin 12.0 - 15.0 g/dL 12.5 14.3 14.6  Hematocrit 36.0 - 46.0 % 38.4 44.0 43.0  Platelets 150 - 400 K/uL 150 174 -   Lipid Panel  No results found for: CHOL, TRIG, HDL, CHOLHDL, VLDL, LDLCALC, LDLDIRECT HEMOGLOBIN A1C Lab Results  Component Value Date   HGBA1C  02/18/2008    5.6 (NOTE)   The ADA recommends the following therapeutic goal for glycemic   control related to Hgb A1C measurement:   Goal of Therapy:   < 7.0% Hgb A1C   Reference: American Diabetes Association: Clinical Practice   Recommendations 2008, Diabetes Care,  2008, 31:(Suppl 1).   MPG 122 02/18/2008   TSH No results for input(s): TSH in the last 8760 hours.  PRN Meds:. There are no discontinued medications. Current Meds  Medication Sig  . aspirin EC 81 MG tablet Take 81 mg by mouth daily.  . cholecalciferol (VITAMIN D3) 25 MCG (1000 UT) tablet Take 1,000 Units by mouth See admin instructions. Take 1000 units by mouth 5 times weekly  . levothyroxine (SYNTHROID, LEVOTHROID) 88 MCG tablet Take 88 mcg by mouth daily before breakfast.  . Multiple Vitamin (MULTIVITAMIN PO) Take 1 tablet by mouth 3 (three) times a week.   Marland Kitchen omeprazole (PRILOSEC OTC) 20 MG tablet Take 20 mg by mouth daily as needed (acid reflux).  . triamcinolone ointment (KENALOG) 0.1 % as needed.  . zolpidem (AMBIEN) 10 MG tablet Take 5 mg by mouth at bedtime as needed for sleep.    Current Facility-Administered Medications for the 09/29/19 encounter (Office Visit) with Miquel Dunn, NP  Medication  . 0.9 %  sodium chloride infusion    Cardiac Studies:   Calcium score CT 09/07/2019:  Calcium score: 0. LM: 0. LAD: 0. Cx: 0. RCA: 0 Heart size is Normal Visible lung fields are: clear Lymph nodes: Not enlarged Abdomen: No  visible lesions   IMPRESSION: No calcified coronary artery plaque.  Echocardiogram 09/05/2019:  Left ventricle cavity is normal in size. Mild concentric hypertrophy of  the left ventricle. Normal global wall motion. Normal LV systolic function  with EF 55%. Doppler evidence of grade I (impaired) diastolic dysfunction,  normal LAP. Calculated EF 55%.  Trileaflet aortic valve with trace aortic stenosis. No regurgitation.  Mild (Grade I) mitral regurgitation.  Mild tricuspid regurgitation. Estimated pulmonary artery systolic pressure  is 24 mmHg.  Assessment:   Atypical chest pain  Mild hyperlipidemia  Abnormal EKG  EKG 09/01/2019: Sinus bradycardia at 59 bpm, normal axis, IRBBB. T wave inversionin anteroseptal leads, cannot exclude ischemia. No changes compared to EKG in 2016.   Recommendations:   Renee Haynes  is a 70 y.o. female  with mild hyperlipidemia, skin cancer in the past, hypothyroidism, known right bundle branch block since 2016, recently evaluated for atypical chest pain.  She underwent calcium score CT and echocardiogram and now presents for follow-up.  I have discussed her recent calcium score CT results that showed calcium score of 0.  She has not had any further chest pain.  Although she has abnormal EKG, given improvement in her symptoms and normal calcium score, do not feel that she needs repeat stress testing at this time.  Her EKG has also been stable since 2016 and 2017.  I have also reviewed and discussed her echocardiogram.  Has grade 1 diastolic dysfunction and mild LVH, but normal LVEF.  I recommended that she continue with risk factor modification.  No history of hypertension.  She has very mild hyperlipidemia, in view of normal calcium score, can certainly continue with lifestyle and dietary changes to help improve lipid control.  If unable to do so with this, could consider statin therapy.  Encouraged her to continue to work on her weight.  We will see her back  on a as needed basis, but  encouraged her to contact me sooner if needed.   Miquel Dunn, MSN, APRN, FNP-C [facility]. [place] Office: [phone] Fax: [phone]

## 2019-10-03 DIAGNOSIS — H2513 Age-related nuclear cataract, bilateral: Secondary | ICD-10-CM | POA: Diagnosis not present

## 2019-10-03 DIAGNOSIS — H04123 Dry eye syndrome of bilateral lacrimal glands: Secondary | ICD-10-CM | POA: Diagnosis not present

## 2019-10-03 DIAGNOSIS — H43813 Vitreous degeneration, bilateral: Secondary | ICD-10-CM | POA: Diagnosis not present

## 2019-10-03 DIAGNOSIS — H31002 Unspecified chorioretinal scars, left eye: Secondary | ICD-10-CM | POA: Diagnosis not present

## 2019-10-10 DIAGNOSIS — L821 Other seborrheic keratosis: Secondary | ICD-10-CM | POA: Diagnosis not present

## 2019-10-10 DIAGNOSIS — D225 Melanocytic nevi of trunk: Secondary | ICD-10-CM | POA: Diagnosis not present

## 2019-10-10 DIAGNOSIS — L814 Other melanin hyperpigmentation: Secondary | ICD-10-CM | POA: Diagnosis not present

## 2019-10-10 DIAGNOSIS — D1801 Hemangioma of skin and subcutaneous tissue: Secondary | ICD-10-CM | POA: Diagnosis not present

## 2019-10-10 DIAGNOSIS — L820 Inflamed seborrheic keratosis: Secondary | ICD-10-CM | POA: Diagnosis not present

## 2019-10-10 DIAGNOSIS — L720 Epidermal cyst: Secondary | ICD-10-CM | POA: Diagnosis not present

## 2019-10-10 DIAGNOSIS — L570 Actinic keratosis: Secondary | ICD-10-CM | POA: Diagnosis not present

## 2019-10-10 DIAGNOSIS — D2271 Melanocytic nevi of right lower limb, including hip: Secondary | ICD-10-CM | POA: Diagnosis not present

## 2019-10-10 DIAGNOSIS — Z85828 Personal history of other malignant neoplasm of skin: Secondary | ICD-10-CM | POA: Diagnosis not present

## 2019-11-21 DIAGNOSIS — Z8669 Personal history of other diseases of the nervous system and sense organs: Secondary | ICD-10-CM | POA: Diagnosis not present

## 2019-11-22 ENCOUNTER — Ambulatory Visit: Payer: PPO | Admitting: Orthopaedic Surgery

## 2019-11-22 ENCOUNTER — Other Ambulatory Visit: Payer: Self-pay

## 2019-11-22 ENCOUNTER — Ambulatory Visit (INDEPENDENT_AMBULATORY_CARE_PROVIDER_SITE_OTHER): Payer: PPO

## 2019-11-22 DIAGNOSIS — M79671 Pain in right foot: Secondary | ICD-10-CM

## 2019-11-22 NOTE — Progress Notes (Signed)
Office Visit Note   Patient: Renee Haynes           Date of Birth: 02/26/1951           MRN: [id number] Visit Date: 11/22/2019              Requested by: Prince Solian, MD [address] [place],   [id number] PCP: Prince Solian, MD   Assessment & Plan: Visit Diagnoses:  1. Pain in right foot     Plan: She may be dealing with pain over an accessory navicular that is become inflamed.  I want her to rest from her exercise walking for the next 2 weeks and continue to wear firm shoes.  I wonder try Voltaren gel in this area as well.  I did go over her x-ray findings.  We will reevaluate her in 2 weeks to see how she is doing overall.  All questions and concerns were answered and addressed.  Follow-Up Instructions: Return in about 2 weeks (around 12/06/2019).   Orders:  Orders Placed This Encounter  Procedures  . XR Foot Complete Right   No orders of the defined types were placed in this encounter.     Procedures: No procedures performed   Clinical Data: No additional findings.   Subjective: Chief Complaint  Patient presents with  . Right Foot - Pain  The patient is very pleasant 69 year old active female patient of mine who has had knee surgery before.  She has been dealing with right foot pain for about 2 weeks now.  She points to the medial aspect of her foot where there is swelling.  It is been painful to bear weight on this area as well.  She denies any injuries but she is an active walker.  She is never injured the right foot before and does report that she wears firm and comfortable shoes while exercise walking.  She is not a diabetic.  She is not a smoker.  HPI  Review of Systems She currently denies any headache, chest pain, shortness of breath, fever, chills, nausea, vomiting.  She denies any foot numbness  Objective: Vital Signs: There were no vitals taken for this visit.  Physical Exam She is alert and orient x3 and in no acute distress Ortho  Exam Examination of her right foot shows a normal-appearing arch.  She has full range of motion of her ankle.  She is neurovascular intact.  She does have significant pain over the midfoot over the navicular area of her foot.  There is no redness in this area. Specialty Comments:  No specialty comments available.  Imaging: XR Foot Complete Right  Result Date: 11/22/2019 3 views of the right foot show no acute findings.  The patient does have an accessory navicular which correlates where she hurts on clinical exam.  Most of her pain is at the medial midfoot area.    PMFS History: Patient Active Problem List   Diagnosis Date Noted  . Status post total left knee replacement 08/26/2018  . Unilateral primary osteoarthritis, left knee 08/15/2018  . Sprain of unspecified ligament of right ankle, initial encounter 06/29/2016  . Rectocele 06/10/2011  . Abnormal ECG 03/26/2011  . Dyspnea on exertion 03/26/2011   Past Medical History:  Diagnosis Date  . Abnormal EKG    recent w/u and neg echo  . Cancer ([place])    BCC chest and Squamous cell on chest  . Dysrhythmia    right bundle blanch block  . GERD (gastroesophageal  reflux disease)   . History of kidney stones   . Hypothyroidism    hypothyroidism  . Osteoarthritis of right knee   . Pneumonia   . RBBB     Family History  Problem Relation Age of Onset  . Breast cancer Sister   . Breast cancer Maternal Grandmother   . Colon polyps Mother   . Hypertension Father   . Diabetes Brother   . Colon cancer Neg Hx   . Esophageal cancer Neg Hx   . Rectal cancer Neg Hx   . Stomach cancer Neg Hx     Past Surgical History:  Procedure Laterality Date  . ABDOMINAL HYSTERECTOMY  2006  . COLONOSCOPY    . KNEE ARTHROPLASTY Right   . POLYPECTOMY    . RECTOCELE REPAIR  06/10/2011   Procedure: POSTERIOR REPAIR (RECTOCELE);  Surgeon: Elveria Royals;  Location: [facility];  Service: Gynecology;  Laterality: N/A;  . TOTAL KNEE ARTHROPLASTY Right 2009   . TOTAL KNEE ARTHROPLASTY Left 08/26/2018   Procedure: LEFT TOTAL KNEE ARTHROPLASTY;  Surgeon: Mcarthur Rossetti, MD;  Location: [facility];  Service: Orthopedics;  Laterality: Left;  . UPPER GASTROINTESTINAL ENDOSCOPY     Social History   Occupational History  . Not on file  Tobacco Use  . Smoking status: Never Smoker  . Smokeless tobacco: Never Used  Substance and Sexual Activity  . Alcohol use: Yes    Alcohol/week: 2.0 standard drinks    Types: 2 Glasses of wine per week    Comment: occasional  . Drug use: No  . Sexual activity: Not on file

## 2020-02-21 DIAGNOSIS — E038 Other specified hypothyroidism: Secondary | ICD-10-CM | POA: Diagnosis not present

## 2020-02-21 DIAGNOSIS — M859 Disorder of bone density and structure, unspecified: Secondary | ICD-10-CM | POA: Diagnosis not present

## 2020-02-21 DIAGNOSIS — E7849 Other hyperlipidemia: Secondary | ICD-10-CM | POA: Diagnosis not present

## 2020-02-23 DIAGNOSIS — F329 Major depressive disorder, single episode, unspecified: Secondary | ICD-10-CM | POA: Diagnosis not present

## 2020-02-23 DIAGNOSIS — E039 Hypothyroidism, unspecified: Secondary | ICD-10-CM | POA: Diagnosis not present

## 2020-02-23 DIAGNOSIS — M8580 Other specified disorders of bone density and structure, unspecified site: Secondary | ICD-10-CM | POA: Diagnosis not present

## 2020-02-23 DIAGNOSIS — E785 Hyperlipidemia, unspecified: Secondary | ICD-10-CM | POA: Diagnosis not present

## 2020-02-23 DIAGNOSIS — K635 Polyp of colon: Secondary | ICD-10-CM | POA: Diagnosis not present

## 2020-02-23 DIAGNOSIS — Z1331 Encounter for screening for depression: Secondary | ICD-10-CM | POA: Diagnosis not present

## 2020-02-23 DIAGNOSIS — M1990 Unspecified osteoarthritis, unspecified site: Secondary | ICD-10-CM | POA: Diagnosis not present

## 2020-02-23 DIAGNOSIS — R079 Chest pain, unspecified: Secondary | ICD-10-CM | POA: Diagnosis not present

## 2020-02-23 DIAGNOSIS — Z0000 Encounter for general adult medical examination without abnormal findings: Secondary | ICD-10-CM | POA: Diagnosis not present

## 2020-02-23 DIAGNOSIS — K219 Gastro-esophageal reflux disease without esophagitis: Secondary | ICD-10-CM | POA: Diagnosis not present

## 2020-02-23 DIAGNOSIS — G459 Transient cerebral ischemic attack, unspecified: Secondary | ICD-10-CM | POA: Diagnosis not present

## 2020-02-23 DIAGNOSIS — G4700 Insomnia, unspecified: Secondary | ICD-10-CM | POA: Diagnosis not present

## 2020-02-23 DIAGNOSIS — M79671 Pain in right foot: Secondary | ICD-10-CM | POA: Diagnosis not present

## 2020-02-23 DIAGNOSIS — R82998 Other abnormal findings in urine: Secondary | ICD-10-CM | POA: Diagnosis not present

## 2020-03-04 DIAGNOSIS — Z20828 Contact with and (suspected) exposure to other viral communicable diseases: Secondary | ICD-10-CM | POA: Diagnosis not present

## 2020-04-14 DIAGNOSIS — S61216A Laceration without foreign body of right little finger without damage to nail, initial encounter: Secondary | ICD-10-CM | POA: Diagnosis not present

## 2020-10-09 DIAGNOSIS — L304 Erythema intertrigo: Secondary | ICD-10-CM | POA: Diagnosis not present

## 2020-10-09 DIAGNOSIS — L821 Other seborrheic keratosis: Secondary | ICD-10-CM | POA: Diagnosis not present

## 2020-10-09 DIAGNOSIS — D1801 Hemangioma of skin and subcutaneous tissue: Secondary | ICD-10-CM | POA: Diagnosis not present

## 2020-10-09 DIAGNOSIS — Z85828 Personal history of other malignant neoplasm of skin: Secondary | ICD-10-CM | POA: Diagnosis not present

## 2020-10-09 DIAGNOSIS — L814 Other melanin hyperpigmentation: Secondary | ICD-10-CM | POA: Diagnosis not present

## 2020-10-09 DIAGNOSIS — L570 Actinic keratosis: Secondary | ICD-10-CM | POA: Diagnosis not present

## 2020-10-09 DIAGNOSIS — L720 Epidermal cyst: Secondary | ICD-10-CM | POA: Diagnosis not present

## 2020-10-09 DIAGNOSIS — D485 Neoplasm of uncertain behavior of skin: Secondary | ICD-10-CM | POA: Diagnosis not present

## 2020-10-09 DIAGNOSIS — D225 Melanocytic nevi of trunk: Secondary | ICD-10-CM | POA: Diagnosis not present

## 2020-10-09 DIAGNOSIS — L438 Other lichen planus: Secondary | ICD-10-CM | POA: Diagnosis not present

## 2020-11-12 DIAGNOSIS — Z1231 Encounter for screening mammogram for malignant neoplasm of breast: Secondary | ICD-10-CM | POA: Diagnosis not present

## 2020-11-13 DIAGNOSIS — H2513 Age-related nuclear cataract, bilateral: Secondary | ICD-10-CM | POA: Diagnosis not present

## 2020-11-13 DIAGNOSIS — H524 Presbyopia: Secondary | ICD-10-CM | POA: Diagnosis not present

## 2020-11-13 DIAGNOSIS — H0100A Unspecified blepharitis right eye, upper and lower eyelids: Secondary | ICD-10-CM | POA: Diagnosis not present

## 2020-11-13 DIAGNOSIS — H25013 Cortical age-related cataract, bilateral: Secondary | ICD-10-CM | POA: Diagnosis not present

## 2020-11-19 DIAGNOSIS — M25532 Pain in left wrist: Secondary | ICD-10-CM | POA: Diagnosis not present

## 2020-11-22 DIAGNOSIS — M79642 Pain in left hand: Secondary | ICD-10-CM | POA: Diagnosis not present

## 2020-12-06 DIAGNOSIS — M79642 Pain in left hand: Secondary | ICD-10-CM | POA: Diagnosis not present

## 2021-01-16 DIAGNOSIS — Z90711 Acquired absence of uterus with remaining cervical stump: Secondary | ICD-10-CM | POA: Diagnosis not present

## 2021-01-16 DIAGNOSIS — Z124 Encounter for screening for malignant neoplasm of cervix: Secondary | ICD-10-CM | POA: Diagnosis not present

## 2021-01-16 DIAGNOSIS — L292 Pruritus vulvae: Secondary | ICD-10-CM | POA: Diagnosis not present

## 2021-01-16 DIAGNOSIS — N951 Menopausal and female climacteric states: Secondary | ICD-10-CM | POA: Diagnosis not present

## 2021-01-16 DIAGNOSIS — Z01411 Encounter for gynecological examination (general) (routine) with abnormal findings: Secondary | ICD-10-CM | POA: Diagnosis not present

## 2021-01-16 DIAGNOSIS — Z6828 Body mass index (BMI) 28.0-28.9, adult: Secondary | ICD-10-CM | POA: Diagnosis not present

## 2021-01-16 DIAGNOSIS — Z01419 Encounter for gynecological examination (general) (routine) without abnormal findings: Secondary | ICD-10-CM | POA: Diagnosis not present

## 2021-01-22 ENCOUNTER — Other Ambulatory Visit: Payer: Self-pay | Admitting: Obstetrics & Gynecology

## 2021-01-22 DIAGNOSIS — E2839 Other primary ovarian failure: Secondary | ICD-10-CM

## 2021-03-14 DIAGNOSIS — E785 Hyperlipidemia, unspecified: Secondary | ICD-10-CM | POA: Diagnosis not present

## 2021-03-14 DIAGNOSIS — M859 Disorder of bone density and structure, unspecified: Secondary | ICD-10-CM | POA: Diagnosis not present

## 2021-03-14 DIAGNOSIS — E039 Hypothyroidism, unspecified: Secondary | ICD-10-CM | POA: Diagnosis not present

## 2021-03-21 DIAGNOSIS — K219 Gastro-esophageal reflux disease without esophagitis: Secondary | ICD-10-CM | POA: Diagnosis not present

## 2021-03-21 DIAGNOSIS — E039 Hypothyroidism, unspecified: Secondary | ICD-10-CM | POA: Diagnosis not present

## 2021-03-21 DIAGNOSIS — Z0000 Encounter for general adult medical examination without abnormal findings: Secondary | ICD-10-CM | POA: Diagnosis not present

## 2021-03-21 DIAGNOSIS — G4700 Insomnia, unspecified: Secondary | ICD-10-CM | POA: Diagnosis not present

## 2021-03-21 DIAGNOSIS — M8580 Other specified disorders of bone density and structure, unspecified site: Secondary | ICD-10-CM | POA: Diagnosis not present

## 2021-03-21 DIAGNOSIS — E785 Hyperlipidemia, unspecified: Secondary | ICD-10-CM | POA: Diagnosis not present

## 2021-03-21 DIAGNOSIS — R82998 Other abnormal findings in urine: Secondary | ICD-10-CM | POA: Diagnosis not present

## 2021-03-21 DIAGNOSIS — K635 Polyp of colon: Secondary | ICD-10-CM | POA: Diagnosis not present

## 2021-03-21 DIAGNOSIS — M1990 Unspecified osteoarthritis, unspecified site: Secondary | ICD-10-CM | POA: Diagnosis not present

## 2021-03-21 DIAGNOSIS — F329 Major depressive disorder, single episode, unspecified: Secondary | ICD-10-CM | POA: Diagnosis not present

## 2021-04-14 DIAGNOSIS — H0011 Chalazion right upper eyelid: Secondary | ICD-10-CM | POA: Diagnosis not present

## 2021-04-24 DIAGNOSIS — H0011 Chalazion right upper eyelid: Secondary | ICD-10-CM | POA: Diagnosis not present

## 2021-05-01 DIAGNOSIS — Z1152 Encounter for screening for COVID-19: Secondary | ICD-10-CM | POA: Diagnosis not present

## 2021-05-01 DIAGNOSIS — Z23 Encounter for immunization: Secondary | ICD-10-CM | POA: Diagnosis not present

## 2021-05-01 DIAGNOSIS — J029 Acute pharyngitis, unspecified: Secondary | ICD-10-CM | POA: Diagnosis not present

## 2021-05-01 DIAGNOSIS — R059 Cough, unspecified: Secondary | ICD-10-CM | POA: Diagnosis not present

## 2021-05-01 DIAGNOSIS — J069 Acute upper respiratory infection, unspecified: Secondary | ICD-10-CM | POA: Diagnosis not present

## 2021-05-01 DIAGNOSIS — R0981 Nasal congestion: Secondary | ICD-10-CM | POA: Diagnosis not present

## 2021-07-09 ENCOUNTER — Encounter: Payer: Self-pay | Admitting: Gastroenterology

## 2021-07-29 ENCOUNTER — Other Ambulatory Visit: Payer: PPO

## 2021-08-01 DIAGNOSIS — N952 Postmenopausal atrophic vaginitis: Secondary | ICD-10-CM | POA: Diagnosis not present

## 2021-08-01 DIAGNOSIS — N8111 Cystocele, midline: Secondary | ICD-10-CM | POA: Diagnosis not present

## 2021-08-01 DIAGNOSIS — N8189 Other female genital prolapse: Secondary | ICD-10-CM | POA: Diagnosis not present

## 2021-08-18 ENCOUNTER — Ambulatory Visit (AMBULATORY_SURGERY_CENTER): Payer: PPO | Admitting: *Deleted

## 2021-08-18 ENCOUNTER — Encounter: Payer: Self-pay | Admitting: Gastroenterology

## 2021-08-18 ENCOUNTER — Other Ambulatory Visit: Payer: Self-pay

## 2021-08-18 VITALS — Ht 65.0 in | Wt 170.0 lb

## 2021-08-18 DIAGNOSIS — Z86010 Personal history of colonic polyps: Secondary | ICD-10-CM

## 2021-08-18 MED ORDER — PLENVU 140 G PO SOLR: 1.0000 | Freq: Once | ORAL | 0 refills | Status: AC

## 2021-08-18 NOTE — Progress Notes (Signed)
Patient's pre-visit was done today over the phone with the patient. Name,DOB and address verified. Patient denies any allergies to Eggs and Soy. Patient denies any problems with anesthesia/sedation. Patient is not taking any diet pills or blood thinners. No home Oxygen.   Packet of Prep instructions mailed to patient including a copy of a consent form & medicare coupon -pt is aware. Prep instructions sent to pt's MyChart-pt aware. Patient understands to call us back with any questions or concerns. Patient is aware of our care-partner policy and DXFPK-44 safety protocol.   EMMI education assigned to the patient for the procedure, sent to [facility].   The patient is COVID-19 vaccinated.

## 2021-08-20 NOTE — Progress Notes (Signed)
I faxed [employer] medicare coupon to [facility] today.

## 2021-08-26 ENCOUNTER — Telehealth: Payer: Self-pay | Admitting: Gastroenterology

## 2021-08-26 NOTE — Telephone Encounter (Signed)
Patient called regarding prep medication said she did not receive the coupon for it.

## 2021-08-26 NOTE — Telephone Encounter (Signed)
Pt received packet but did receive coupon for [employer] medicare coupon [facility] and gave them coupon information over the phone BIN [id number] PCN- [id number] Grp- [id number] ID- [id number]  Renee Haynes PV

## 2021-09-02 ENCOUNTER — Other Ambulatory Visit: Payer: Self-pay

## 2021-09-02 ENCOUNTER — Encounter: Payer: Self-pay | Admitting: Gastroenterology

## 2021-09-02 ENCOUNTER — Telehealth: Payer: Self-pay | Admitting: Gastroenterology

## 2021-09-02 ENCOUNTER — Ambulatory Visit (AMBULATORY_SURGERY_CENTER): Payer: PPO | Admitting: Gastroenterology

## 2021-09-02 VITALS — BP 113/75 | HR 92 | Temp 98.6°F | Resp 18 | Ht 65.0 in | Wt 170.0 lb

## 2021-09-02 DIAGNOSIS — Z86010 Personal history of colonic polyps: Secondary | ICD-10-CM | POA: Diagnosis not present

## 2021-09-02 DIAGNOSIS — D123 Benign neoplasm of transverse colon: Secondary | ICD-10-CM

## 2021-09-02 DIAGNOSIS — D120 Benign neoplasm of cecum: Secondary | ICD-10-CM

## 2021-09-02 MED ORDER — SODIUM CHLORIDE 0.9 % IV SOLN: 500.0000 mL | Freq: Once | INTRAVENOUS | Status: DC

## 2021-09-02 NOTE — Op Note (Addendum)
[facility] Patient Name: Renee Haynes Procedure Date: 09/02/2021 3:01 PM MRN: [id number] Endoscopist: Mauri Pole , MD Age: 71 Referring MD:  Date of Birth: 12/02/1950 Gender: Female Account #: [id number] Procedure:                Colonoscopy Indications:              High risk colon cancer surveillance: Personal                            history of colonic polyps, High risk colon cancer                            surveillance: Personal history of adenoma (10 mm or                            greater in size) Medicines:                Monitored Anesthesia Care Procedure:                Pre-Anesthesia Assessment:                           - Prior to the procedure, a History and Physical                            was performed, and patient medications and                            allergies were reviewed. The patient's tolerance of                            previous anesthesia was also reviewed. The risks                            and benefits of the procedure and the sedation                            options and risks were discussed with the patient.                            All questions were answered, and informed consent                            was obtained. Prior Anticoagulants: The patient has                            taken no previous anticoagulant or antiplatelet                            agents. ASA Grade Assessment: III - A patient with                            severe systemic disease. After reviewing the risks  and benefits, the patient was deemed in                            satisfactory condition to undergo the procedure.                           After obtaining informed consent, the colonoscope                            was passed under direct vision. Throughout the                            procedure, the patient's blood pressure, pulse, and                            oxygen saturations were monitored  continuously. The                            PCF-HQ190L Colonoscope was introduced through the                            anus and advanced to the the terminal ileum, with                            identification of the appendiceal orifice and IC                            valve. The colonoscopy was performed without                            difficulty. The patient tolerated the procedure                            well. The quality of the bowel preparation was                            excellent. The ileocecal valve, appendiceal                            orifice, and rectum were photographed. Scope In: 3:16:00 PM Scope Out: 3:41:38 PM Scope Withdrawal Time: 0 hours 10 minutes 42 seconds  Total Procedure Duration: 0 hours 25 minutes 38 seconds  Findings:                 The perianal and digital rectal examinations were                            normal.                           A 7 mm polyp was found in the ileocecal valve. The                            polyp was sessile. The polyp was removed with a  cold snare. Resection and retrieval were complete.                           Two sessile polyps were found in the transverse                            colon. The polyps were 4 to 6 mm in size. These                            polyps were removed with a cold snare. Resection                            and retrieval were complete.                           Scattered small and large-mouthed diverticula were                            found in the sigmoid colon.                           Non-bleeding external and internal hemorrhoids were                            found during retroflexion. The hemorrhoids were                            medium-sized. Complications:            No immediate complications. Estimated Blood Loss:     Estimated blood loss was minimal. Impression:               - One 7 mm polyp at the ileocecal valve, removed                             with a cold snare. Resected and retrieved.                           - Two 4 to 6 mm polyps in the transverse colon,                            removed with a cold snare. Resected and retrieved.                           - Diverticulosis in the sigmoid colon.                           - Non-bleeding external and internal hemorrhoids. Recommendation:           - Patient has a contact number available for                            emergencies. The signs and symptoms of potential  delayed complications were discussed with the                            patient. Return to normal activities tomorrow.                            Written discharge instructions were provided to the                            patient.                           - Resume previous diet.                           - Continue present medications.                           - Await pathology results.                           - Repeat colonoscopy date to be determined after                            pending pathology results are reviewed for                            surveillance based on pathology results. Mauri Pole, MD 09/02/2021 3:47:47 PM This report has been signed electronically.

## 2021-09-02 NOTE — Telephone Encounter (Signed)
Returned pts call.  She was able to finish the 1st half of the prep last night but vomited about 30 mins after finishing tthe 2nd half this morning.  She states that her stools are clear to yellow liquid.  Instructed her to try to drink as much clear liquids as she can tolerate before 12:00. Pt verbalized understanding.

## 2021-09-02 NOTE — Progress Notes (Signed)
[facility] History and Physical   Primary Care Physician:  Prince Solian, MD   Reason for Procedure:  History of adenomatous colon polyps  Plan:    Surveillance colonoscopy with possible interventions as needed     HPI: Renee Haynes is a very pleasant 71 y.o. female here for surveillance colonoscopy. Denies any nausea, vomiting, abdominal pain, melena or bright red blood per rectum  The risks and benefits as well as alternatives of endoscopic procedure(s) have been discussed and reviewed. All questions answered. The patient agrees to proceed.    Past Medical History:  Diagnosis Date   Abnormal EKG    recent w/u and neg echo   Cancer ([place])    BCC chest and Squamous cell on chest   Dysrhythmia    right bundle blanch block   GERD (gastroesophageal reflux disease)    History of kidney stones    Hypothyroidism    hypothyroidism   Osteoarthritis of right knee    Pneumonia    RBBB     Past Surgical History:  Procedure Laterality Date   ABDOMINAL HYSTERECTOMY  2006   COLONOSCOPY  08/2018   Dr.Arlesia Kiel   KNEE ARTHROPLASTY Right    POLYPECTOMY     RECTOCELE REPAIR  06/10/2011   Procedure: POSTERIOR REPAIR (RECTOCELE);  Surgeon: Elveria Royals;  Location: [facility];  Service: Gynecology;  Laterality: N/A;   TOTAL KNEE ARTHROPLASTY Right 2009   TOTAL KNEE ARTHROPLASTY Left 08/26/2018   Procedure: LEFT TOTAL KNEE ARTHROPLASTY;  Surgeon: Mcarthur Rossetti, MD;  Location: [facility];  Service: Orthopedics;  Laterality: Left;   UPPER GASTROINTESTINAL ENDOSCOPY      Prior to Admission medications   Medication Sig Start Date End Date Taking? Authorizing Provider  levothyroxine (SYNTHROID, LEVOTHROID) 88 MCG tablet Take 88 mcg by mouth daily before breakfast.   Yes [provider]  cholecalciferol (VITAMIN D3) 25 MCG (1000 UT) tablet Take 1,000 Units by mouth See admin instructions. Take 1000 units by mouth 5 times weekly    [provider]   Multiple Vitamin (MULTIVITAMIN PO) Take 1 tablet by mouth 3 (three) times a week.     [provider]  omeprazole (PRILOSEC OTC) 20 MG tablet Take 20 mg by mouth daily as needed (acid reflux).    [provider]  zolpidem (AMBIEN) 10 MG tablet Take 5 mg by mouth at bedtime as needed for sleep.     [provider]    Current Outpatient Medications  Medication Sig Dispense Refill   levothyroxine (SYNTHROID, LEVOTHROID) 88 MCG tablet Take 88 mcg by mouth daily before breakfast.     cholecalciferol (VITAMIN D3) 25 MCG (1000 UT) tablet Take 1,000 Units by mouth See admin instructions. Take 1000 units by mouth 5 times weekly     Multiple Vitamin (MULTIVITAMIN PO) Take 1 tablet by mouth 3 (three) times a week.      omeprazole (PRILOSEC OTC) 20 MG tablet Take 20 mg by mouth daily as needed (acid reflux).     zolpidem (AMBIEN) 10 MG tablet Take 5 mg by mouth at bedtime as needed for sleep.      Current Facility-Administered Medications  Medication Dose Route Frequency Provider Last Rate Last Admin   0.9 %  sodium chloride infusion  500 mL Intravenous Once Mauri Pole, MD        Allergies as of 09/02/2021 - Review Complete 09/02/2021  Allergen Reaction Noted   Penicillins cross reactors Rash 03/08/2011   Sulfa drugs  cross reactors Rash 03/08/2011    Family History  Problem Relation Age of Onset   Breast cancer Sister    Breast cancer Maternal Grandmother    Colon polyps Mother    Hypertension Father    Diabetes Brother    Colon cancer Neg Hx    Esophageal cancer Neg Hx    Rectal cancer Neg Hx    Stomach cancer Neg Hx     Social History   Socioeconomic History   Marital status: Divorced    Spouse name: Not on file   Number of children: 2   Years of education: Not on file   Highest education level: Not on file  Occupational History   Not on file  Tobacco Use   Smoking status: Never   Smokeless tobacco: Never  Vaping Use   Vaping Use: Never  used  Substance and Sexual Activity   Alcohol use: Not Currently    Comment: occasional   Drug use: No   Sexual activity: Not on file  Other Topics Concern   Not on file  Social History Narrative   Not on file   Social Determinants of Health   Financial Resource Strain: Not on file  Food Insecurity: Not on file  Transportation Needs: Not on file  Physical Activity: Not on file  Stress: Not on file  Social Connections: Not on file  Intimate Partner Violence: Not on file    Review of Systems:  All other review of systems negative except as mentioned in the HPI.  Physical Exam: Vital signs in last 24 hours: BP 126/73    Pulse 64    Temp 98.6 F (37 C)    Ht 5\' 5"  (1.651 m)    Wt 170 lb (77.1 kg)    SpO2 98%    BMI 28.29 kg/m  General:   Alert, NAD Lungs:  Clear .   Heart:  Regular rate and rhythm Abdomen:  Soft, nontender and nondistended. Neuro/Psych:  Alert and cooperative. Normal mood and affect. A and O x 3  Reviewed labs, radiology imaging, old records and pertinent past GI work up  Patient is appropriate for planned procedure(s) and anesthesia in an ambulatory setting   K. Denzil Magnuson , MD [phone]

## 2021-09-02 NOTE — Progress Notes (Deleted)
Called to room to assist during endoscopic procedure.  Patient ID and intended procedure confirmed with present staff. Received instructions for my participation in the procedure from the performing physician.  

## 2021-09-02 NOTE — Progress Notes (Signed)
VSS, transported to PACU °

## 2021-09-02 NOTE — Progress Notes (Signed)
Called to room to assist during endoscopic procedure.  Patient ID and intended procedure confirmed with present staff. Received instructions for my participation in the procedure from the performing physician.  

## 2021-09-02 NOTE — Patient Instructions (Signed)
Handouts given for polyps and diverticulosis.  Resume previous diet.  Continue present medications.  Await pathology results.    YOU HAD AN ENDOSCOPIC PROCEDURE TODAY AT [facility]:   Refer to the procedure report that was given to you for any specific questions about what was found during the examination.  If the procedure report does not answer your questions, please call your gastroenterologist to clarify.  If you requested that your care partner not be given the details of your procedure findings, then the procedure report has been included in a sealed envelope for you to review at your convenience later.  YOU SHOULD EXPECT: Some feelings of bloating in the abdomen. Passage of more gas than usual.  Walking can help get rid of the air that was put into your GI tract during the procedure and reduce the bloating. If you had a lower endoscopy (such as a colonoscopy or flexible sigmoidoscopy) you may notice spotting of blood in your stool or on the toilet paper. If you underwent a bowel prep for your procedure, you may not have a normal bowel movement for a few days.  Please Note:  You might notice some irritation and congestion in your nose or some drainage.  This is from the oxygen used during your procedure.  There is no need for concern and it should clear up in a day or so.  SYMPTOMS TO REPORT IMMEDIATELY:  Following lower endoscopy (colonoscopy or flexible sigmoidoscopy):  Excessive amounts of blood in the stool  Significant tenderness or worsening of abdominal pains  Swelling of the abdomen that is new, acute  Fever of 100F or higher  For urgent or emergent issues, a gastroenterologist can be reached at any hour by calling [phone]. Do not use MyChart messaging for urgent concerns.    DIET:  We do recommend a small meal at first, but then you may proceed to your regular diet.  Drink plenty of fluids but you should avoid alcoholic beverages for 24  hours.  ACTIVITY:  You should plan to take it easy for the rest of today and you should NOT DRIVE or use heavy machinery until tomorrow (because of the sedation medicines used during the test).    FOLLOW UP: Our staff will call the number listed on your records 48-72 hours following your procedure to check on you and address any questions or concerns that you may have regarding the information given to you following your procedure. If we do not reach you, we will leave a message.  We will attempt to reach you two times.  During this call, we will ask if you have developed any symptoms of COVID 19. If you develop any symptoms (ie: fever, flu-like symptoms, shortness of breath, cough etc.) before then, please call [phone].  If you test positive for Covid 19 in the 2 weeks post procedure, please call and report this information to [place].    If any biopsies were taken you will be contacted by phone or by letter within the next 1-3 weeks.  Please call us at [phone] if you have not heard about the biopsies in 3 weeks.    SIGNATURES/CONFIDENTIALITY: You and/or your care partner have signed paperwork which will be entered into your electronic medical record.  These signatures attest to the fact that that the information above on your After Visit Summary has been reviewed and is understood.  Full responsibility of the confidentiality of this discharge information lies with you and/or your  care-partner.

## 2021-09-02 NOTE — Telephone Encounter (Signed)
Patient called and stated that she threw up her last prep that she took this morning. Asking if its okay and if she can still have procedure. Please advise.

## 2021-09-02 NOTE — Telephone Encounter (Signed)
Ok, thank you

## 2021-09-03 DIAGNOSIS — N8111 Cystocele, midline: Secondary | ICD-10-CM | POA: Diagnosis not present

## 2021-09-03 DIAGNOSIS — N952 Postmenopausal atrophic vaginitis: Secondary | ICD-10-CM | POA: Diagnosis not present

## 2021-09-04 ENCOUNTER — Telehealth: Payer: Self-pay

## 2021-09-04 NOTE — Telephone Encounter (Signed)
°  Follow up Call-  Call back number 09/02/2021  Post procedure Call Back phone  # [phone]  Permission to leave phone message Yes  Some recent data might be hidden     Patient questions:  Do you have a fever, pain , or abdominal swelling? No. Pain Score  0 *  Have you tolerated food without any problems? Yes.    Have you been able to return to your normal activities? Yes.    Do you have any questions about your discharge instructions: Diet   No. Medications  No. Follow up visit  No.  Do you have questions or concerns about your Care? No.  Actions: * If pain score is 4 or above: No action needed, pain <4.

## 2021-09-08 DIAGNOSIS — Z803 Family history of malignant neoplasm of breast: Secondary | ICD-10-CM | POA: Diagnosis not present

## 2021-09-08 DIAGNOSIS — N8111 Cystocele, midline: Secondary | ICD-10-CM | POA: Diagnosis not present

## 2021-09-08 DIAGNOSIS — B372 Candidiasis of skin and nail: Secondary | ICD-10-CM | POA: Diagnosis not present

## 2021-09-08 DIAGNOSIS — N8189 Other female genital prolapse: Secondary | ICD-10-CM | POA: Diagnosis not present

## 2021-09-11 ENCOUNTER — Other Ambulatory Visit: Payer: PPO

## 2021-09-18 ENCOUNTER — Encounter: Payer: Self-pay | Admitting: Gastroenterology

## 2021-10-13 DIAGNOSIS — Z85828 Personal history of other malignant neoplasm of skin: Secondary | ICD-10-CM | POA: Diagnosis not present

## 2021-10-13 DIAGNOSIS — L304 Erythema intertrigo: Secondary | ICD-10-CM | POA: Diagnosis not present

## 2021-10-13 DIAGNOSIS — L814 Other melanin hyperpigmentation: Secondary | ICD-10-CM | POA: Diagnosis not present

## 2021-10-13 DIAGNOSIS — L853 Xerosis cutis: Secondary | ICD-10-CM | POA: Diagnosis not present

## 2021-10-13 DIAGNOSIS — L821 Other seborrheic keratosis: Secondary | ICD-10-CM | POA: Diagnosis not present

## 2021-10-13 DIAGNOSIS — L820 Inflamed seborrheic keratosis: Secondary | ICD-10-CM | POA: Diagnosis not present

## 2021-10-13 DIAGNOSIS — D1801 Hemangioma of skin and subcutaneous tissue: Secondary | ICD-10-CM | POA: Diagnosis not present

## 2021-10-14 DIAGNOSIS — M8589 Other specified disorders of bone density and structure, multiple sites: Secondary | ICD-10-CM | POA: Diagnosis not present

## 2021-11-19 DIAGNOSIS — H00011 Hordeolum externum right upper eyelid: Secondary | ICD-10-CM | POA: Diagnosis not present

## 2021-11-19 DIAGNOSIS — H52203 Unspecified astigmatism, bilateral: Secondary | ICD-10-CM | POA: Diagnosis not present

## 2021-11-19 DIAGNOSIS — H43813 Vitreous degeneration, bilateral: Secondary | ICD-10-CM | POA: Diagnosis not present

## 2021-11-19 DIAGNOSIS — H25013 Cortical age-related cataract, bilateral: Secondary | ICD-10-CM | POA: Diagnosis not present

## 2021-12-08 ENCOUNTER — Ambulatory Visit (INDEPENDENT_AMBULATORY_CARE_PROVIDER_SITE_OTHER): Payer: PPO

## 2021-12-08 ENCOUNTER — Ambulatory Visit (INDEPENDENT_AMBULATORY_CARE_PROVIDER_SITE_OTHER): Payer: PPO | Admitting: Podiatry

## 2021-12-08 ENCOUNTER — Encounter: Payer: Self-pay | Admitting: Podiatry

## 2021-12-08 ENCOUNTER — Ambulatory Visit: Payer: PPO | Admitting: Podiatry

## 2021-12-08 DIAGNOSIS — M722 Plantar fascial fibromatosis: Secondary | ICD-10-CM

## 2021-12-08 MED ORDER — TRIAMCINOLONE ACETONIDE 10 MG/ML IJ SUSP
10.0000 mg | Freq: Once | INTRAMUSCULAR | Status: AC
  Administered 2021-12-08: 10 mg

## 2021-12-08 MED ORDER — MELOXICAM 15 MG PO TABS: 15.0000 mg | ORAL_TABLET | Freq: Every day | ORAL | 2 refills | Status: AC

## 2021-12-08 NOTE — Patient Instructions (Signed)

## 2021-12-08 NOTE — Progress Notes (Signed)
Subjective:  ? ?Patient ID: Renee Haynes, female   DOB: 71 y.o.   MRN: [id number]  ? ?HPI ?Patient states she is having a lot of heel pain for the last 4 months left and has tried numerous things at home and is not getting better and is due to go to [place] in a few days and is desperate to be able to walk.  States it is been getting gradually worse patient does not smoke likes to be active ? ? ?Review of Systems  ?All other systems reviewed and are negative. ? ? ?   ?Objective:  ?Physical Exam ?Vitals and nursing note reviewed.  ?Constitutional:   ?   Appearance: She is well-developed.  ?Pulmonary:  ?   Effort: Pulmonary effort is normal.  ?Musculoskeletal:     ?   General: Normal range of motion.  ?Skin: ?   General: Skin is warm.  ?Neurological:  ?   Mental Status: She is alert.  ?  ?Neurovascular status intact muscle strength found to be adequate range of motion found to be adequate.  Patient is found to have exquisite discomfort left plantar fascia at the insertional point tendon calcaneus with inflammation fluid around the medial band.  Patient is found to have good digital perfusion well oriented x3 moderate flatfoot deformity ? ?   ?Assessment:  ?Acute plantar fasciitis left with inflammation fluid around the medial band ? ?   ?Plan:  ?H&P x-rays reviewed condition discussed.  Today I did sterile prep injected the plantar fascia left 3 mg Kenalog 5 mg Xylocaine and applied fascial brace explaining the lifting of the arch this will do in the stress he will take off the plantar heel and the insertional point of the tendon to the calcaneus.  Wrote prescription for Mobic and gave instructions for physical therapy and will reappoint 3 weeks when she returns from trip ? ?X-rays indicate small spur no indication stress fracture arthritis ?   ? ? ?

## 2021-12-31 ENCOUNTER — Encounter: Payer: Self-pay | Admitting: Podiatry

## 2021-12-31 ENCOUNTER — Ambulatory Visit: Payer: PPO | Admitting: Podiatry

## 2021-12-31 DIAGNOSIS — M722 Plantar fascial fibromatosis: Secondary | ICD-10-CM

## 2021-12-31 NOTE — Progress Notes (Signed)
Subjective:   Patient ID: Renee Haynes, female   DOB: 71 y.o.   MRN: [id number]   HPI Patient presents stating my heel is feeling much better with significant reduction of pain   ROS      Objective:  Physical Exam  Neurovascular status intact with patient's left heel doing much better minimal discomfort only upon deep palpation with good alignment noted no other pathology     Assessment:  Improvement of Planter fasciitis left with conservative treatment     Plan:  Reviewed continued exercises physical therapy and shoe gear modifications along with anti-inflammatories.  Patient will be seen back to recheck encouraged to call with questions concerns

## 2022-02-13 DIAGNOSIS — Z1231 Encounter for screening mammogram for malignant neoplasm of breast: Secondary | ICD-10-CM | POA: Diagnosis not present

## 2022-04-02 DIAGNOSIS — R7989 Other specified abnormal findings of blood chemistry: Secondary | ICD-10-CM | POA: Diagnosis not present

## 2022-04-02 DIAGNOSIS — M8580 Other specified disorders of bone density and structure, unspecified site: Secondary | ICD-10-CM | POA: Diagnosis not present

## 2022-04-02 DIAGNOSIS — E039 Hypothyroidism, unspecified: Secondary | ICD-10-CM | POA: Diagnosis not present

## 2022-04-02 DIAGNOSIS — E785 Hyperlipidemia, unspecified: Secondary | ICD-10-CM | POA: Diagnosis not present

## 2022-04-08 DIAGNOSIS — K635 Polyp of colon: Secondary | ICD-10-CM | POA: Diagnosis not present

## 2022-04-08 DIAGNOSIS — E785 Hyperlipidemia, unspecified: Secondary | ICD-10-CM | POA: Diagnosis not present

## 2022-04-08 DIAGNOSIS — M8580 Other specified disorders of bone density and structure, unspecified site: Secondary | ICD-10-CM | POA: Diagnosis not present

## 2022-04-08 DIAGNOSIS — E039 Hypothyroidism, unspecified: Secondary | ICD-10-CM | POA: Diagnosis not present

## 2022-04-08 DIAGNOSIS — Z0000 Encounter for general adult medical examination without abnormal findings: Secondary | ICD-10-CM | POA: Diagnosis not present

## 2022-04-08 DIAGNOSIS — M722 Plantar fascial fibromatosis: Secondary | ICD-10-CM | POA: Diagnosis not present

## 2022-04-08 DIAGNOSIS — M1990 Unspecified osteoarthritis, unspecified site: Secondary | ICD-10-CM | POA: Diagnosis not present

## 2022-04-08 DIAGNOSIS — K219 Gastro-esophageal reflux disease without esophagitis: Secondary | ICD-10-CM | POA: Diagnosis not present

## 2022-04-08 DIAGNOSIS — Z23 Encounter for immunization: Secondary | ICD-10-CM | POA: Diagnosis not present

## 2022-04-08 DIAGNOSIS — F329 Major depressive disorder, single episode, unspecified: Secondary | ICD-10-CM | POA: Diagnosis not present

## 2022-05-04 DIAGNOSIS — S46011A Strain of muscle(s) and tendon(s) of the rotator cuff of right shoulder, initial encounter: Secondary | ICD-10-CM | POA: Diagnosis not present

## 2022-05-11 ENCOUNTER — Ambulatory Visit: Payer: PPO | Admitting: Podiatry

## 2022-05-13 DIAGNOSIS — M25511 Pain in right shoulder: Secondary | ICD-10-CM | POA: Diagnosis not present

## 2022-05-14 ENCOUNTER — Ambulatory Visit: Payer: PPO | Admitting: Podiatry

## 2022-05-14 ENCOUNTER — Encounter: Payer: Self-pay | Admitting: Podiatry

## 2022-05-14 DIAGNOSIS — M7752 Other enthesopathy of left foot: Secondary | ICD-10-CM

## 2022-05-14 MED ORDER — DICLOFENAC SODIUM 75 MG PO TBEC: 75.0000 mg | DELAYED_RELEASE_TABLET | Freq: Two times a day (BID) | ORAL | 2 refills | Status: AC

## 2022-05-14 MED ORDER — TRIAMCINOLONE ACETONIDE 10 MG/ML IJ SUSP
10.0000 mg | Freq: Once | INTRAMUSCULAR | Status: AC
  Administered 2022-05-14: 10 mg

## 2022-05-15 DIAGNOSIS — M75121 Complete rotator cuff tear or rupture of right shoulder, not specified as traumatic: Secondary | ICD-10-CM | POA: Diagnosis not present

## 2022-05-15 NOTE — Progress Notes (Signed)
Subjective:   Patient ID: Renee Haynes, female   DOB: 71 y.o.   MRN: [id number]   HPI Patient presents stating that the left ankle has become really sore and she has had trouble with the heel but that its not bothering her like the ankle.  She is getting ready to go to [place] and is desperate for some kind of treatment   ROS      Objective:  Physical Exam  Neurovascular status intact with acute inflammation around the left sinus tarsi fluid buildup around the joint painful when pressed mild discomfort within the plantar fascia     Assessment:  Acute capsulitis of the sinus tarsi subtalar joint left with improvement of Planter fasciitis     Plan:  H&P reviewed and reviewed x-rays.  Today I went ahead and get a focus on this area I did sterile prep injected the sinus tarsi 3 mg Kenalog 5 mg Xylocaine and I advised on anti-inflammatories placing on diclofenac.  Reappoint as symptoms indicate may require other treatment when she returns from trip  X-rays indicate that there does not appear to be a fracture or an advanced arthritic condition associated with this pain

## 2022-06-02 DIAGNOSIS — M7541 Impingement syndrome of right shoulder: Secondary | ICD-10-CM | POA: Diagnosis not present

## 2022-06-02 DIAGNOSIS — S46011A Strain of muscle(s) and tendon(s) of the rotator cuff of right shoulder, initial encounter: Secondary | ICD-10-CM | POA: Diagnosis not present

## 2022-06-02 DIAGNOSIS — G8918 Other acute postprocedural pain: Secondary | ICD-10-CM | POA: Diagnosis not present

## 2022-06-12 DIAGNOSIS — S46011A Strain of muscle(s) and tendon(s) of the rotator cuff of right shoulder, initial encounter: Secondary | ICD-10-CM | POA: Diagnosis not present

## 2022-06-12 DIAGNOSIS — M7541 Impingement syndrome of right shoulder: Secondary | ICD-10-CM | POA: Diagnosis not present

## 2022-06-16 DIAGNOSIS — M75121 Complete rotator cuff tear or rupture of right shoulder, not specified as traumatic: Secondary | ICD-10-CM | POA: Diagnosis not present

## 2022-06-16 DIAGNOSIS — M25611 Stiffness of right shoulder, not elsewhere classified: Secondary | ICD-10-CM | POA: Diagnosis not present

## 2022-06-17 DIAGNOSIS — M25611 Stiffness of right shoulder, not elsewhere classified: Secondary | ICD-10-CM | POA: Diagnosis not present

## 2022-06-17 DIAGNOSIS — M75121 Complete rotator cuff tear or rupture of right shoulder, not specified as traumatic: Secondary | ICD-10-CM | POA: Diagnosis not present

## 2022-06-22 DIAGNOSIS — M75121 Complete rotator cuff tear or rupture of right shoulder, not specified as traumatic: Secondary | ICD-10-CM | POA: Diagnosis not present

## 2022-06-22 DIAGNOSIS — M25611 Stiffness of right shoulder, not elsewhere classified: Secondary | ICD-10-CM | POA: Diagnosis not present

## 2022-07-01 DIAGNOSIS — M75121 Complete rotator cuff tear or rupture of right shoulder, not specified as traumatic: Secondary | ICD-10-CM | POA: Diagnosis not present

## 2022-07-01 DIAGNOSIS — M25611 Stiffness of right shoulder, not elsewhere classified: Secondary | ICD-10-CM | POA: Diagnosis not present

## 2022-07-06 DIAGNOSIS — M75121 Complete rotator cuff tear or rupture of right shoulder, not specified as traumatic: Secondary | ICD-10-CM | POA: Diagnosis not present

## 2022-07-06 DIAGNOSIS — M25611 Stiffness of right shoulder, not elsewhere classified: Secondary | ICD-10-CM | POA: Diagnosis not present

## 2022-07-08 DIAGNOSIS — M25611 Stiffness of right shoulder, not elsewhere classified: Secondary | ICD-10-CM | POA: Diagnosis not present

## 2022-07-08 DIAGNOSIS — M75121 Complete rotator cuff tear or rupture of right shoulder, not specified as traumatic: Secondary | ICD-10-CM | POA: Diagnosis not present

## 2022-07-16 DIAGNOSIS — M75121 Complete rotator cuff tear or rupture of right shoulder, not specified as traumatic: Secondary | ICD-10-CM | POA: Diagnosis not present

## 2022-07-16 DIAGNOSIS — M25611 Stiffness of right shoulder, not elsewhere classified: Secondary | ICD-10-CM | POA: Diagnosis not present

## 2022-07-21 DIAGNOSIS — M75121 Complete rotator cuff tear or rupture of right shoulder, not specified as traumatic: Secondary | ICD-10-CM | POA: Diagnosis not present

## 2022-07-21 DIAGNOSIS — M25611 Stiffness of right shoulder, not elsewhere classified: Secondary | ICD-10-CM | POA: Diagnosis not present

## 2022-07-23 DIAGNOSIS — M25611 Stiffness of right shoulder, not elsewhere classified: Secondary | ICD-10-CM | POA: Diagnosis not present

## 2022-07-23 DIAGNOSIS — M75121 Complete rotator cuff tear or rupture of right shoulder, not specified as traumatic: Secondary | ICD-10-CM | POA: Diagnosis not present

## 2022-07-28 DIAGNOSIS — M75121 Complete rotator cuff tear or rupture of right shoulder, not specified as traumatic: Secondary | ICD-10-CM | POA: Diagnosis not present

## 2022-07-28 DIAGNOSIS — M25611 Stiffness of right shoulder, not elsewhere classified: Secondary | ICD-10-CM | POA: Diagnosis not present

## 2022-07-30 DIAGNOSIS — M25611 Stiffness of right shoulder, not elsewhere classified: Secondary | ICD-10-CM | POA: Diagnosis not present

## 2022-07-30 DIAGNOSIS — M75121 Complete rotator cuff tear or rupture of right shoulder, not specified as traumatic: Secondary | ICD-10-CM | POA: Diagnosis not present

## 2022-08-04 DIAGNOSIS — M25611 Stiffness of right shoulder, not elsewhere classified: Secondary | ICD-10-CM | POA: Diagnosis not present

## 2022-08-04 DIAGNOSIS — M75121 Complete rotator cuff tear or rupture of right shoulder, not specified as traumatic: Secondary | ICD-10-CM | POA: Diagnosis not present

## 2022-08-06 DIAGNOSIS — M25611 Stiffness of right shoulder, not elsewhere classified: Secondary | ICD-10-CM | POA: Diagnosis not present

## 2022-08-06 DIAGNOSIS — M75121 Complete rotator cuff tear or rupture of right shoulder, not specified as traumatic: Secondary | ICD-10-CM | POA: Diagnosis not present

## 2022-08-11 DIAGNOSIS — M25611 Stiffness of right shoulder, not elsewhere classified: Secondary | ICD-10-CM | POA: Diagnosis not present

## 2022-08-11 DIAGNOSIS — M75121 Complete rotator cuff tear or rupture of right shoulder, not specified as traumatic: Secondary | ICD-10-CM | POA: Diagnosis not present

## 2022-08-13 DIAGNOSIS — M75121 Complete rotator cuff tear or rupture of right shoulder, not specified as traumatic: Secondary | ICD-10-CM | POA: Diagnosis not present

## 2022-08-13 DIAGNOSIS — M25611 Stiffness of right shoulder, not elsewhere classified: Secondary | ICD-10-CM | POA: Diagnosis not present

## 2022-08-18 DIAGNOSIS — M75121 Complete rotator cuff tear or rupture of right shoulder, not specified as traumatic: Secondary | ICD-10-CM | POA: Diagnosis not present

## 2022-08-18 DIAGNOSIS — M25611 Stiffness of right shoulder, not elsewhere classified: Secondary | ICD-10-CM | POA: Diagnosis not present

## 2022-08-25 DIAGNOSIS — M75121 Complete rotator cuff tear or rupture of right shoulder, not specified as traumatic: Secondary | ICD-10-CM | POA: Diagnosis not present

## 2022-08-25 DIAGNOSIS — M25611 Stiffness of right shoulder, not elsewhere classified: Secondary | ICD-10-CM | POA: Diagnosis not present

## 2022-08-27 DIAGNOSIS — M75121 Complete rotator cuff tear or rupture of right shoulder, not specified as traumatic: Secondary | ICD-10-CM | POA: Diagnosis not present

## 2022-08-27 DIAGNOSIS — M25611 Stiffness of right shoulder, not elsewhere classified: Secondary | ICD-10-CM | POA: Diagnosis not present

## 2022-09-01 DIAGNOSIS — M75121 Complete rotator cuff tear or rupture of right shoulder, not specified as traumatic: Secondary | ICD-10-CM | POA: Diagnosis not present

## 2022-09-01 DIAGNOSIS — M25611 Stiffness of right shoulder, not elsewhere classified: Secondary | ICD-10-CM | POA: Diagnosis not present

## 2022-09-08 DIAGNOSIS — M25611 Stiffness of right shoulder, not elsewhere classified: Secondary | ICD-10-CM | POA: Diagnosis not present

## 2022-09-08 DIAGNOSIS — M75121 Complete rotator cuff tear or rupture of right shoulder, not specified as traumatic: Secondary | ICD-10-CM | POA: Diagnosis not present

## 2022-09-10 DIAGNOSIS — M75121 Complete rotator cuff tear or rupture of right shoulder, not specified as traumatic: Secondary | ICD-10-CM | POA: Diagnosis not present

## 2022-09-10 DIAGNOSIS — M25611 Stiffness of right shoulder, not elsewhere classified: Secondary | ICD-10-CM | POA: Diagnosis not present

## 2022-09-15 DIAGNOSIS — M75121 Complete rotator cuff tear or rupture of right shoulder, not specified as traumatic: Secondary | ICD-10-CM | POA: Diagnosis not present

## 2022-09-15 DIAGNOSIS — M25611 Stiffness of right shoulder, not elsewhere classified: Secondary | ICD-10-CM | POA: Diagnosis not present

## 2022-09-17 DIAGNOSIS — M75121 Complete rotator cuff tear or rupture of right shoulder, not specified as traumatic: Secondary | ICD-10-CM | POA: Diagnosis not present

## 2022-09-17 DIAGNOSIS — M25611 Stiffness of right shoulder, not elsewhere classified: Secondary | ICD-10-CM | POA: Diagnosis not present

## 2022-09-22 DIAGNOSIS — M75121 Complete rotator cuff tear or rupture of right shoulder, not specified as traumatic: Secondary | ICD-10-CM | POA: Diagnosis not present

## 2022-09-22 DIAGNOSIS — M25611 Stiffness of right shoulder, not elsewhere classified: Secondary | ICD-10-CM | POA: Diagnosis not present

## 2022-09-24 DIAGNOSIS — M25611 Stiffness of right shoulder, not elsewhere classified: Secondary | ICD-10-CM | POA: Diagnosis not present

## 2022-09-24 DIAGNOSIS — M75121 Complete rotator cuff tear or rupture of right shoulder, not specified as traumatic: Secondary | ICD-10-CM | POA: Diagnosis not present

## 2022-09-29 DIAGNOSIS — M75121 Complete rotator cuff tear or rupture of right shoulder, not specified as traumatic: Secondary | ICD-10-CM | POA: Diagnosis not present

## 2022-09-29 DIAGNOSIS — M25611 Stiffness of right shoulder, not elsewhere classified: Secondary | ICD-10-CM | POA: Diagnosis not present

## 2022-10-01 DIAGNOSIS — M25611 Stiffness of right shoulder, not elsewhere classified: Secondary | ICD-10-CM | POA: Diagnosis not present

## 2022-10-01 DIAGNOSIS — M75121 Complete rotator cuff tear or rupture of right shoulder, not specified as traumatic: Secondary | ICD-10-CM | POA: Diagnosis not present

## 2022-10-05 DIAGNOSIS — H31002 Unspecified chorioretinal scars, left eye: Secondary | ICD-10-CM | POA: Diagnosis not present

## 2022-10-05 DIAGNOSIS — H43812 Vitreous degeneration, left eye: Secondary | ICD-10-CM | POA: Diagnosis not present

## 2022-10-06 DIAGNOSIS — M75121 Complete rotator cuff tear or rupture of right shoulder, not specified as traumatic: Secondary | ICD-10-CM | POA: Diagnosis not present

## 2022-10-06 DIAGNOSIS — M25611 Stiffness of right shoulder, not elsewhere classified: Secondary | ICD-10-CM | POA: Diagnosis not present

## 2022-10-08 DIAGNOSIS — M25611 Stiffness of right shoulder, not elsewhere classified: Secondary | ICD-10-CM | POA: Diagnosis not present

## 2022-10-08 DIAGNOSIS — M75121 Complete rotator cuff tear or rupture of right shoulder, not specified as traumatic: Secondary | ICD-10-CM | POA: Diagnosis not present

## 2022-10-12 DIAGNOSIS — M25611 Stiffness of right shoulder, not elsewhere classified: Secondary | ICD-10-CM | POA: Diagnosis not present

## 2022-10-13 DIAGNOSIS — M25611 Stiffness of right shoulder, not elsewhere classified: Secondary | ICD-10-CM | POA: Diagnosis not present

## 2022-10-13 DIAGNOSIS — M75121 Complete rotator cuff tear or rupture of right shoulder, not specified as traumatic: Secondary | ICD-10-CM | POA: Diagnosis not present

## 2022-10-15 DIAGNOSIS — M75121 Complete rotator cuff tear or rupture of right shoulder, not specified as traumatic: Secondary | ICD-10-CM | POA: Diagnosis not present

## 2022-10-15 DIAGNOSIS — M25611 Stiffness of right shoulder, not elsewhere classified: Secondary | ICD-10-CM | POA: Diagnosis not present

## 2022-10-20 DIAGNOSIS — M75121 Complete rotator cuff tear or rupture of right shoulder, not specified as traumatic: Secondary | ICD-10-CM | POA: Diagnosis not present

## 2022-10-20 DIAGNOSIS — M25611 Stiffness of right shoulder, not elsewhere classified: Secondary | ICD-10-CM | POA: Diagnosis not present

## 2022-10-22 DIAGNOSIS — M75121 Complete rotator cuff tear or rupture of right shoulder, not specified as traumatic: Secondary | ICD-10-CM | POA: Diagnosis not present

## 2022-10-22 DIAGNOSIS — M25611 Stiffness of right shoulder, not elsewhere classified: Secondary | ICD-10-CM | POA: Diagnosis not present

## 2022-10-27 DIAGNOSIS — M75121 Complete rotator cuff tear or rupture of right shoulder, not specified as traumatic: Secondary | ICD-10-CM | POA: Diagnosis not present

## 2022-10-27 DIAGNOSIS — M25611 Stiffness of right shoulder, not elsewhere classified: Secondary | ICD-10-CM | POA: Diagnosis not present

## 2022-11-03 DIAGNOSIS — M25611 Stiffness of right shoulder, not elsewhere classified: Secondary | ICD-10-CM | POA: Diagnosis not present

## 2022-11-03 DIAGNOSIS — M75121 Complete rotator cuff tear or rupture of right shoulder, not specified as traumatic: Secondary | ICD-10-CM | POA: Diagnosis not present

## 2022-11-05 DIAGNOSIS — M75121 Complete rotator cuff tear or rupture of right shoulder, not specified as traumatic: Secondary | ICD-10-CM | POA: Diagnosis not present

## 2022-11-05 DIAGNOSIS — M25611 Stiffness of right shoulder, not elsewhere classified: Secondary | ICD-10-CM | POA: Diagnosis not present

## 2022-11-10 DIAGNOSIS — M25611 Stiffness of right shoulder, not elsewhere classified: Secondary | ICD-10-CM | POA: Diagnosis not present

## 2022-11-10 DIAGNOSIS — M75121 Complete rotator cuff tear or rupture of right shoulder, not specified as traumatic: Secondary | ICD-10-CM | POA: Diagnosis not present

## 2022-11-23 DIAGNOSIS — M25611 Stiffness of right shoulder, not elsewhere classified: Secondary | ICD-10-CM | POA: Diagnosis not present

## 2022-11-26 DIAGNOSIS — D1801 Hemangioma of skin and subcutaneous tissue: Secondary | ICD-10-CM | POA: Diagnosis not present

## 2022-11-26 DIAGNOSIS — Z85828 Personal history of other malignant neoplasm of skin: Secondary | ICD-10-CM | POA: Diagnosis not present

## 2022-11-26 DIAGNOSIS — L304 Erythema intertrigo: Secondary | ICD-10-CM | POA: Diagnosis not present

## 2022-11-26 DIAGNOSIS — L918 Other hypertrophic disorders of the skin: Secondary | ICD-10-CM | POA: Diagnosis not present

## 2022-11-26 DIAGNOSIS — L570 Actinic keratosis: Secondary | ICD-10-CM | POA: Diagnosis not present

## 2022-11-26 DIAGNOSIS — L821 Other seborrheic keratosis: Secondary | ICD-10-CM | POA: Diagnosis not present

## 2022-11-30 DIAGNOSIS — H43813 Vitreous degeneration, bilateral: Secondary | ICD-10-CM | POA: Diagnosis not present

## 2022-11-30 DIAGNOSIS — H25013 Cortical age-related cataract, bilateral: Secondary | ICD-10-CM | POA: Diagnosis not present

## 2022-11-30 DIAGNOSIS — H524 Presbyopia: Secondary | ICD-10-CM | POA: Diagnosis not present

## 2022-11-30 DIAGNOSIS — H52203 Unspecified astigmatism, bilateral: Secondary | ICD-10-CM | POA: Diagnosis not present

## 2022-11-30 DIAGNOSIS — H04123 Dry eye syndrome of bilateral lacrimal glands: Secondary | ICD-10-CM | POA: Diagnosis not present

## 2022-11-30 DIAGNOSIS — H2513 Age-related nuclear cataract, bilateral: Secondary | ICD-10-CM | POA: Diagnosis not present

## 2023-02-18 DIAGNOSIS — Z1231 Encounter for screening mammogram for malignant neoplasm of breast: Secondary | ICD-10-CM | POA: Diagnosis not present

## 2023-03-18 DIAGNOSIS — Z85828 Personal history of other malignant neoplasm of skin: Secondary | ICD-10-CM | POA: Diagnosis not present

## 2023-03-18 DIAGNOSIS — L820 Inflamed seborrheic keratosis: Secondary | ICD-10-CM | POA: Diagnosis not present

## 2023-03-18 DIAGNOSIS — L918 Other hypertrophic disorders of the skin: Secondary | ICD-10-CM | POA: Diagnosis not present

## 2023-03-18 DIAGNOSIS — C44529 Squamous cell carcinoma of skin of other part of trunk: Secondary | ICD-10-CM | POA: Diagnosis not present

## 2023-03-18 DIAGNOSIS — D485 Neoplasm of uncertain behavior of skin: Secondary | ICD-10-CM | POA: Diagnosis not present

## 2023-04-07 ENCOUNTER — Emergency Department (HOSPITAL_BASED_OUTPATIENT_CLINIC_OR_DEPARTMENT_OTHER): Payer: PPO

## 2023-04-07 ENCOUNTER — Other Ambulatory Visit (HOSPITAL_BASED_OUTPATIENT_CLINIC_OR_DEPARTMENT_OTHER): Payer: Self-pay

## 2023-04-07 ENCOUNTER — Emergency Department (HOSPITAL_BASED_OUTPATIENT_CLINIC_OR_DEPARTMENT_OTHER)
Admission: EM | Admit: 2023-04-07 | Discharge: 2023-04-07 | Disposition: A | Discharge status: Home / Self Care | Payer: PPO | Attending: Emergency Medicine | Admitting: Emergency Medicine

## 2023-04-07 ENCOUNTER — Emergency Department (HOSPITAL_BASED_OUTPATIENT_CLINIC_OR_DEPARTMENT_OTHER): Payer: PPO | Admitting: Radiology

## 2023-04-07 ENCOUNTER — Other Ambulatory Visit: Payer: Self-pay

## 2023-04-07 ENCOUNTER — Encounter (HOSPITAL_BASED_OUTPATIENT_CLINIC_OR_DEPARTMENT_OTHER): Payer: Self-pay | Admitting: Emergency Medicine

## 2023-04-07 DIAGNOSIS — E039 Hypothyroidism, unspecified: Secondary | ICD-10-CM | POA: Diagnosis not present

## 2023-04-07 DIAGNOSIS — M79642 Pain in left hand: Secondary | ICD-10-CM | POA: Diagnosis not present

## 2023-04-07 DIAGNOSIS — S60222A Contusion of left hand, initial encounter: Secondary | ICD-10-CM | POA: Diagnosis not present

## 2023-04-07 DIAGNOSIS — Z79899 Other long term (current) drug therapy: Secondary | ICD-10-CM | POA: Insufficient documentation

## 2023-04-07 DIAGNOSIS — W01198A Fall on same level from slipping, tripping and stumbling with subsequent striking against other object, initial encounter: Secondary | ICD-10-CM | POA: Diagnosis not present

## 2023-04-07 DIAGNOSIS — S0990XA Unspecified injury of head, initial encounter: Secondary | ICD-10-CM | POA: Diagnosis not present

## 2023-04-07 NOTE — ED Notes (Signed)
Pt fell and hit left side of face

## 2023-04-07 NOTE — ED Provider Notes (Signed)
[place] EMERGENCY DEPARTMENT AT [facility] Provider Note   CSN: [id number] Arrival date & time: 04/07/23  4098     History  Chief Complaint  Patient presents with   Fall   Head Injury    Renee Haynes is a 72 y.o. female with pmh significant for hypothyroidism who presents with mechanical, nonsyncopal fall yesterday.  Patient reports that she was walking, stumbled, versus got caught up in her feet, and fell onto the left hand and left side of head.  She denies loss of consciousness, nausea, vomiting, headache, vision changes, numbness, tingling.  She does endorse a fair amount of swelling to the left hand, concerned about possible fracture, and also wants to make sure that she does not have any kind of head bleed or neck injury.  She has not take anything for pain prior to arrival.  She rates her pain overall 3/10.   Fall  Head Injury      Home Medications Prior to Admission medications   Medication Sig Start Date End Date Taking? Authorizing Provider  cholecalciferol (VITAMIN D3) 25 MCG (1000 UT) tablet Take 1,000 Units by mouth See admin instructions. Take 1000 units by mouth 5 times weekly    [provider]  diclofenac (VOLTAREN) 75 MG EC tablet Take 1 tablet (75 mg total) by mouth 2 (two) times daily. 05/14/22   Lenn Sink, DPM  levothyroxine (SYNTHROID, LEVOTHROID) 88 MCG tablet Take 88 mcg by mouth daily before breakfast.    [provider]  meloxicam (MOBIC) 15 MG tablet Take 1 tablet (15 mg total) by mouth daily. 12/08/21   Lenn Sink, DPM  Multiple Vitamin (MULTIVITAMIN PO) Take 1 tablet by mouth 3 (three) times a week.     [provider]  omeprazole (PRILOSEC OTC) 20 MG tablet Take 20 mg by mouth daily as needed (acid reflux).    [provider]  zolpidem (AMBIEN) 10 MG tablet Take 5 mg by mouth at bedtime as needed for sleep.     [provider]      Allergies    Penicillins cross reactors and  Sulfa drugs cross reactors    Review of Systems   Review of Systems  All other systems reviewed and are negative.   Physical Exam Updated Vital Signs BP (!) 133/95 (BP Location: Right Arm)   Pulse 70   Temp 97.7 F (36.5 C)   Resp 17   Ht 5\' 5"  (1.651 m)   Wt 77.1 kg   SpO2 98%   BMI 28.29 kg/m  Physical Exam Vitals and nursing note reviewed.  Constitutional:      General: She is not in acute distress.    Appearance: Normal appearance.  HENT:     Head: Normocephalic and atraumatic.  Eyes:     General:        Right eye: No discharge.        Left eye: No discharge.  Cardiovascular:     Rate and Rhythm: Normal rate and regular rhythm.  Pulmonary:     Effort: Pulmonary effort is normal. No respiratory distress.  Musculoskeletal:        General: No deformity.     Comments: No significant midline cervical spinal tenderness, no significant cervical paraspinous muscle tenderness. Intact strength 5/5 of bilateral upper and lower extremities. Some soft tissue swelling, pain on the medial aspect of the left hand with some bruising. Intact grip strength, intact flexion, extension of digits on affected hand. No  palpable stepoff, deformity of the left hand  Skin:    General: Skin is warm and dry.  Neurological:     Mental Status: She is alert and oriented to person, place, and time.  Psychiatric:        Mood and Affect: Mood normal.        Behavior: Behavior normal.     ED Results / Procedures / Treatments   Labs (all labs ordered are listed, but only abnormal results are displayed) Labs Reviewed - No data to display  EKG None  Radiology DG Hand Complete Left  Result Date: 04/07/2023 CLINICAL DATA:  Post fall, now with pain involving the left fifth digit. EXAM: LEFT HAND - COMPLETE 3+ VIEW COMPARISON:  None Available. FINDINGS: No fracture or dislocation. Joint spaces are preserved. No erosions. No evidence of chondrocalcinosis. Regional soft tissues appear normal. No  radiopaque foreign body. IMPRESSION: No fracture or dislocation with special attention paid to the fifth digit. Electronically Signed   By: Simonne Come M.D.   On: 04/07/2023 10:15    Procedures Procedures    Medications Ordered in ED Medications - No data to display  ED Course/ Medical Decision Making/ A&P                                 Medical Decision Making Amount and/or Complexity of Data Reviewed Radiology: ordered.   This patient is a 72 y.o. female who presents to the ED for concern of fall, hand injury, head injury.   Differential diagnoses prior to evaluation: epidural hematoma, subdural hematoma, skull fracture, subarachnoid hemorrhage, unstable cervical spine fracture, concussion vs other MSK injury, fracture, dislocation, hematoma vs contusion of the left hand  Past Medical History / Social History / Additional history: Chart reviewed. Pertinent results include: hypothyroidism, does not take blood thinners  Physical Exam: Physical exam performed. The pertinent findings include: Patient with some bruising about the left orbit without significant soft tissue swelling.  Normal extraocular movements, CN II through XII grossly intact, no other focal neurologic deficits noted.  Intact strength 5/5 bilateral upper and lower extremities, some swelling on the dorsum of the left hand above the fifth metacarpal with normal range of motion, normal strength.  No palpable step-off, deformity noted.  No tenderness over anatomic snuffbox.  Radial, ulnar pulses 2+ in the affected left upper extremity.  I independently interpreted imaging including plain film radiographs of the left hand, CT head, CT C-spine which shows no evidence of acute fracture, dislocation, intracranial bleed, or other significant injury. I agree with the radiologist interpretation.  Medications / Treatment: Encouraged ibuprofen, Tylenol as needed, ice to help with swelling, and discussed that bruising should improve  with time.   Disposition: After consideration of the diagnostic results and the patients response to treatment, I feel that patient with no evidence of fracture, dislocation, or other significant intracranial injury, she feels well, is in no acute distress, and has no focal neurologic deficits, I think that she is stable for discharge at this time.Marland Kitchen   emergency department workup does not suggest an emergent condition requiring admission or immediate intervention beyond what has been performed at this time. The plan is: as above. The patient is safe for discharge and has been instructed to return immediately for worsening symptoms, change in symptoms or any other concerns.  Final Clinical Impression(s) / ED Diagnoses Final diagnoses:  Injury of head, initial encounter  Contusion of  dorsum of left hand    Rx / DC Orders ED Discharge Orders     None         Olene Floss, PA-C 04/07/23 1051    Franne Forts, DO 04/13/23 0009

## 2023-04-07 NOTE — ED Triage Notes (Signed)
Pt arrives to ED with c/o fall yesterday. Pt notes she fell and hit her head on concrete. She also notes left hand pain.

## 2023-04-07 NOTE — Discharge Instructions (Addendum)
Please use Tylenol or ibuprofen for pain.  You may use 600 mg ibuprofen every 6 hours or 1000 mg of Tylenol every 6 hours.  You may choose to alternate between the 2.  This would be most effective.  Not to exceed 4 g of Tylenol within 24 hours.  Not to exceed 3200 mg ibuprofen 24 hours.  You can apply ice to the affected left hand for 15 minutes at a time up to twice an hour to help with swelling, pain.

## 2023-04-07 NOTE — ED Notes (Signed)
Transported to [facility]

## 2023-04-07 NOTE — ED Notes (Signed)
Dc instructions reviewed with patient. Patient voiced understanding. Dc with belongings.  °

## 2023-04-15 DIAGNOSIS — L820 Inflamed seborrheic keratosis: Secondary | ICD-10-CM | POA: Diagnosis not present

## 2023-04-15 DIAGNOSIS — Z85828 Personal history of other malignant neoplasm of skin: Secondary | ICD-10-CM | POA: Diagnosis not present

## 2023-04-15 DIAGNOSIS — C44529 Squamous cell carcinoma of skin of other part of trunk: Secondary | ICD-10-CM | POA: Diagnosis not present

## 2023-05-04 ENCOUNTER — Telehealth: Payer: Self-pay

## 2023-05-04 DIAGNOSIS — Z1389 Encounter for screening for other disorder: Secondary | ICD-10-CM | POA: Diagnosis not present

## 2023-05-04 DIAGNOSIS — E785 Hyperlipidemia, unspecified: Secondary | ICD-10-CM | POA: Diagnosis not present

## 2023-05-04 DIAGNOSIS — M8580 Other specified disorders of bone density and structure, unspecified site: Secondary | ICD-10-CM | POA: Diagnosis not present

## 2023-05-04 DIAGNOSIS — E039 Hypothyroidism, unspecified: Secondary | ICD-10-CM | POA: Diagnosis not present

## 2023-05-04 NOTE — Telephone Encounter (Signed)
Transition Care Management Follow-up Telephone Call Date of discharge and from where: [facility] 9/4 How have you been since you were released from the hospital? Doing fine  and healing well Any questions or concerns?No  Items Reviewed: Did the pt receive and understand the discharge instructions provided? Yes  Medications obtained and verified? Yes  Other? No  Any new allergies since your discharge? No  Dietary orders reviewed? No Do you have support at home? Yes    Follow up appointments reviewed:  PCP Hospital f/u appt confirmed? Yes  Scheduled to see  on  @ . Specialist Hospital f/u appt confirmed? No  Scheduled to see  on @ . Are transportation arrangements needed? No  If their condition worsens, is the pt aware to call PCP or go to the Emergency Dept.? Yes Was the patient provided with contact information for the PCP's office or ED? Yes Was to pt encouraged to call back with questions or concerns? Yes

## 2023-05-11 DIAGNOSIS — K635 Polyp of colon: Secondary | ICD-10-CM | POA: Diagnosis not present

## 2023-05-11 DIAGNOSIS — G4700 Insomnia, unspecified: Secondary | ICD-10-CM | POA: Diagnosis not present

## 2023-05-11 DIAGNOSIS — Z0000 Encounter for general adult medical examination without abnormal findings: Secondary | ICD-10-CM | POA: Diagnosis not present

## 2023-05-11 DIAGNOSIS — Z1339 Encounter for screening examination for other mental health and behavioral disorders: Secondary | ICD-10-CM | POA: Diagnosis not present

## 2023-05-11 DIAGNOSIS — G250 Essential tremor: Secondary | ICD-10-CM | POA: Diagnosis not present

## 2023-05-11 DIAGNOSIS — M1990 Unspecified osteoarthritis, unspecified site: Secondary | ICD-10-CM | POA: Diagnosis not present

## 2023-05-11 DIAGNOSIS — E785 Hyperlipidemia, unspecified: Secondary | ICD-10-CM | POA: Diagnosis not present

## 2023-05-11 DIAGNOSIS — R82998 Other abnormal findings in urine: Secondary | ICD-10-CM | POA: Diagnosis not present

## 2023-05-11 DIAGNOSIS — M8580 Other specified disorders of bone density and structure, unspecified site: Secondary | ICD-10-CM | POA: Diagnosis not present

## 2023-05-11 DIAGNOSIS — Z1331 Encounter for screening for depression: Secondary | ICD-10-CM | POA: Diagnosis not present

## 2023-05-11 DIAGNOSIS — F419 Anxiety disorder, unspecified: Secondary | ICD-10-CM | POA: Diagnosis not present

## 2023-05-11 DIAGNOSIS — Z23 Encounter for immunization: Secondary | ICD-10-CM | POA: Diagnosis not present

## 2023-05-11 DIAGNOSIS — E039 Hypothyroidism, unspecified: Secondary | ICD-10-CM | POA: Diagnosis not present

## 2023-12-02 DIAGNOSIS — H0100A Unspecified blepharitis right eye, upper and lower eyelids: Secondary | ICD-10-CM | POA: Diagnosis not present

## 2023-12-02 DIAGNOSIS — H524 Presbyopia: Secondary | ICD-10-CM | POA: Diagnosis not present

## 2023-12-02 DIAGNOSIS — H43813 Vitreous degeneration, bilateral: Secondary | ICD-10-CM | POA: Diagnosis not present

## 2023-12-02 DIAGNOSIS — H2513 Age-related nuclear cataract, bilateral: Secondary | ICD-10-CM | POA: Diagnosis not present

## 2023-12-02 DIAGNOSIS — H25013 Cortical age-related cataract, bilateral: Secondary | ICD-10-CM | POA: Diagnosis not present

## 2023-12-02 DIAGNOSIS — H0100B Unspecified blepharitis left eye, upper and lower eyelids: Secondary | ICD-10-CM | POA: Diagnosis not present

## 2023-12-02 DIAGNOSIS — H5203 Hypermetropia, bilateral: Secondary | ICD-10-CM | POA: Diagnosis not present

## 2023-12-20 DIAGNOSIS — J069 Acute upper respiratory infection, unspecified: Secondary | ICD-10-CM | POA: Diagnosis not present

## 2023-12-28 DIAGNOSIS — D1801 Hemangioma of skin and subcutaneous tissue: Secondary | ICD-10-CM | POA: Diagnosis not present

## 2023-12-28 DIAGNOSIS — L814 Other melanin hyperpigmentation: Secondary | ICD-10-CM | POA: Diagnosis not present

## 2023-12-28 DIAGNOSIS — L821 Other seborrheic keratosis: Secondary | ICD-10-CM | POA: Diagnosis not present

## 2023-12-28 DIAGNOSIS — Z85828 Personal history of other malignant neoplasm of skin: Secondary | ICD-10-CM | POA: Diagnosis not present

## 2023-12-28 DIAGNOSIS — D225 Melanocytic nevi of trunk: Secondary | ICD-10-CM | POA: Diagnosis not present

## 2023-12-28 DIAGNOSIS — L304 Erythema intertrigo: Secondary | ICD-10-CM | POA: Diagnosis not present

## 2023-12-28 DIAGNOSIS — L820 Inflamed seborrheic keratosis: Secondary | ICD-10-CM | POA: Diagnosis not present

## 2024-03-04 ENCOUNTER — Emergency Department (HOSPITAL_COMMUNITY)

## 2024-03-04 ENCOUNTER — Encounter (HOSPITAL_COMMUNITY): Payer: Self-pay

## 2024-03-04 ENCOUNTER — Emergency Department (HOSPITAL_COMMUNITY)
Admission: EM | Admit: 2024-03-04 | Discharge: 2024-03-04 | Disposition: A | Discharge status: Home / Self Care | Attending: Emergency Medicine | Admitting: Emergency Medicine

## 2024-03-04 ENCOUNTER — Other Ambulatory Visit: Payer: Self-pay

## 2024-03-04 DIAGNOSIS — R0781 Pleurodynia: Secondary | ICD-10-CM | POA: Diagnosis not present

## 2024-03-04 DIAGNOSIS — K824 Cholesterolosis of gallbladder: Secondary | ICD-10-CM | POA: Insufficient documentation

## 2024-03-04 DIAGNOSIS — R509 Fever, unspecified: Secondary | ICD-10-CM | POA: Diagnosis not present

## 2024-03-04 DIAGNOSIS — R0789 Other chest pain: Secondary | ICD-10-CM | POA: Diagnosis present

## 2024-03-04 DIAGNOSIS — R1011 Right upper quadrant pain: Secondary | ICD-10-CM | POA: Diagnosis not present

## 2024-03-04 DIAGNOSIS — Z20822 Contact with and (suspected) exposure to covid-19: Secondary | ICD-10-CM | POA: Diagnosis not present

## 2024-03-04 LAB — CBC WITH DIFFERENTIAL/PLATELET
Abs Immature Granulocytes: 0.01 K/uL (ref 0.00–0.07)
Basophils Absolute: 0 K/uL (ref 0.0–0.1)
Basophils Relative: 1 %
Eosinophils Absolute: 0.1 K/uL (ref 0.0–0.5)
Eosinophils Relative: 1 %
HCT: 41.2 % (ref 36.0–46.0)
Hemoglobin: 13.6 g/dL (ref 12.0–15.0)
Immature Granulocytes: 0 %
Lymphocytes Relative: 19 %
Lymphs Abs: 1.2 K/uL (ref 0.7–4.0)
MCH: 28.6 pg (ref 26.0–34.0)
MCHC: 33 g/dL (ref 30.0–36.0)
MCV: 86.6 fL (ref 80.0–100.0)
Monocytes Absolute: 0.6 K/uL (ref 0.1–1.0)
Monocytes Relative: 9 %
Neutro Abs: 4.5 K/uL (ref 1.7–7.7)
Neutrophils Relative %: 70 %
Platelets: 155 K/uL (ref 150–400)
RBC: 4.76 MIL/uL (ref 3.87–5.11)
RDW: 13.7 % (ref 11.5–15.5)
WBC: 6.4 K/uL (ref 4.0–10.5)
nRBC: 0 % (ref 0.0–0.2)

## 2024-03-04 LAB — URINALYSIS, ROUTINE W REFLEX MICROSCOPIC
Bilirubin Urine: NEGATIVE
Glucose, UA: NEGATIVE mg/dL
Hgb urine dipstick: NEGATIVE
Ketones, ur: NEGATIVE mg/dL
Nitrite: NEGATIVE
Protein, ur: NEGATIVE mg/dL
Specific Gravity, Urine: 1.002 — ABNORMAL LOW (ref 1.005–1.030)
pH: 6 (ref 5.0–8.0)

## 2024-03-04 LAB — COMPREHENSIVE METABOLIC PANEL WITH GFR
ALT: 20 U/L (ref 0–44)
AST: 21 U/L (ref 15–41)
Albumin: 3.6 g/dL (ref 3.5–5.0)
Alkaline Phosphatase: 77 U/L (ref 38–126)
Anion gap: 6 (ref 5–15)
BUN: 13 mg/dL (ref 8–23)
CO2: 24 mmol/L (ref 22–32)
Calcium: 9.6 mg/dL (ref 8.9–10.3)
Chloride: 106 mmol/L (ref 98–111)
Creatinine, Ser: 0.8 mg/dL (ref 0.44–1.00)
GFR, Estimated: >60 mL/min (ref >60)
Glucose, Bld: 123 mg/dL — ABNORMAL HIGH (ref 70–99)
Potassium: 4.1 mmol/L (ref 3.5–5.1)
Sodium: 136 mmol/L (ref 135–145)
Total Bilirubin: 0.9 mg/dL (ref 0.0–1.2)
Total Protein: 6.5 g/dL (ref 6.5–8.1)

## 2024-03-04 LAB — LIPASE, BLOOD: Lipase: 28 U/L (ref 11–51)

## 2024-03-04 MED ORDER — ONDANSETRON 4 MG PO TBDP
4.0000 mg | ORAL_TABLET | Freq: Three times a day (TID) | ORAL | 0 refills | Status: AC | PRN
Start: 2024-03-04 — End: ?

## 2024-03-04 MED ORDER — DOXYCYCLINE HYCLATE 100 MG PO CAPS: 100.0000 mg | ORAL_CAPSULE | Freq: Two times a day (BID) | ORAL | 0 refills | Status: AC

## 2024-03-04 MED ORDER — DOXYCYCLINE HYCLATE 100 MG PO TABS
100.0000 mg | ORAL_TABLET | Freq: Once | ORAL | Status: AC
  Administered 2024-03-04: 100 mg via ORAL
  Filled 2024-03-04: qty 1

## 2024-03-04 NOTE — ED Triage Notes (Signed)
 Pt c.o RUQ Pain after she was leaning over a trash can cleaning on Tuesday. Pt went to UC and sent here for further eval. Xray of ribcage was negative for any acute fractures and pt was febrile at 100.3 while at [facility].

## 2024-03-04 NOTE — ED Provider Notes (Signed)
 [place] EMERGENCY DEPARTMENT AT [facility] Provider Note   CSN: [id number] Arrival date & time: 03/04/24  1527     Patient presents with: Abdominal Pain   Renee Haynes is a 73 y.o. female presenting to the ED with abdominal pain.  Patient reports on Tuesday, 4 days ago, she took out her trash and leaned over the rim of the can.  She had a soreness pain under her right breast immediately.  It has gradually worsened over the past 4 days.  She now has nausea.  She is having regular BM daily.  She feels she may have had a fever yesterday, felt lousy. Denies sore throat, cough, headache.  She went to UC today where they were concerned she had a temp 100.53F and abdominal discomfort and sent her to the ED.  She had xrays done at [facility] and per my review of the records, no reported evident rib fractures.  She continues having worsening right lower chest wall pain, tender to touch, worse with inspiration.   HPI     Prior to Admission medications   Medication Sig Start Date End Date Taking? Authorizing Provider  doxycycline  (VIBRAMYCIN ) 100 MG capsule Take 1 capsule (100 mg total) by mouth 2 (two) times daily for 7 days. 03/05/24 03/12/24 Yes Kaylyn Garrow, Donnice PARAS, MD  ondansetron  (ZOFRAN -ODT) 4 MG disintegrating tablet Take 1 tablet (4 mg total) by mouth every 8 (eight) hours as needed for up to 12 doses for nausea or vomiting. 03/04/24  Yes Cottie Donnice PARAS, MD  cholecalciferol (VITAMIN D3) 25 MCG (1000 UT) tablet Take 1,000 Units by mouth See admin instructions. Take 1000 units by mouth 5 times weekly    [provider]  diclofenac  (VOLTAREN ) 75 MG EC tablet Take 1 tablet (75 mg total) by mouth 2 (two) times daily. 05/14/22   Magdalen Pasco RAMAN, DPM  levothyroxine  (SYNTHROID , LEVOTHROID) 88 MCG tablet Take 88 mcg by mouth daily before breakfast.    [provider]  meloxicam  (MOBIC ) 15 MG tablet Take 1 tablet (15 mg total) by mouth daily. 12/08/21   Magdalen Pasco RAMAN, DPM   Multiple Vitamin (MULTIVITAMIN PO) Take 1 tablet by mouth 3 (three) times a week.     [provider]  omeprazole (PRILOSEC OTC) 20 MG tablet Take 20 mg by mouth daily as needed (acid reflux).    [provider]  zolpidem  (AMBIEN ) 10 MG tablet Take 5 mg by mouth at bedtime as needed for sleep.     [provider]    Allergies: Penicillins cross reactors and Sulfa drugs cross reactors    Review of Systems  Updated Vital Signs BP 131/87   Pulse 80   Temp 99.2 F (37.3 C) (Oral)   Resp 18   SpO2 97%   Physical Exam Constitutional:      General: She is not in acute distress. HENT:     Head: Normocephalic and atraumatic.  Eyes:     Conjunctiva/sclera: Conjunctivae normal.     Pupils: Pupils are equal, round, and reactive to light.  Cardiovascular:     Rate and Rhythm: Normal rate and regular rhythm.  Pulmonary:     Effort: Pulmonary effort is normal. No respiratory distress.  Abdominal:     General: There is no distension.     Tenderness: There is abdominal tenderness in the right upper quadrant. There is no guarding or rebound. Negative signs include Murphy's sign.  Musculoskeletal:     Comments: Right anterior lower  chest wall rib tenderness (ribs 11 and 12, mid-clavicular line)  Skin:    General: Skin is warm and dry.  Neurological:     General: No focal deficit present.     Mental Status: She is alert. Mental status is at baseline.  Psychiatric:        Mood and Affect: Mood normal.        Behavior: Behavior normal.     (all labs ordered are listed, but only abnormal results are displayed) Labs Reviewed  COMPREHENSIVE METABOLIC PANEL WITH GFR - Abnormal; Notable for the following components:      Result Value   Glucose, Bld 123 (*)    All other components within normal limits  URINALYSIS, ROUTINE W REFLEX MICROSCOPIC - Abnormal; Notable for the following components:   Color, Urine STRAW (*)    Specific Gravity, Urine 1.002 (*)     Leukocytes,Ua SMALL (*)    Bacteria, UA MANY (*)    All other components within normal limits  CBC WITH DIFFERENTIAL/PLATELET  LIPASE, BLOOD    EKG: None  Radiology: US  Abdomen Limited RUQ (LIVER/GB) Result Date: 03/04/2024 CLINICAL DATA:  [id number] Abdominal pain [id number] EXAM: ULTRASOUND ABDOMEN LIMITED RIGHT UPPER QUADRANT COMPARISON:  None Available. FINDINGS: Gallbladder: Physiologically distended. No abnormal wall thickening. No pericholecystic free fluid. There is an isoechoic, sessile 4 x 5 mm polyp. Please see follow-up recommendations below. Sonographic Murphy's sign was negative as per the technologist. Common bile duct: Diameter: Up to 3.6 mm.  No intrahepatic bile duct dilation. Liver: There is poor sound beam penetration to the deep / posterior aspects of the liver as a result of increased hepatic echogenicity which reduces the sensitivity of ultrasound for the detection of focal masses. That being said, no focal mass is identified. Portal vein is patent on color Doppler imaging with normal direction of blood flow towards the liver. Other: None. IMPRESSION: 1. Increased hepatic echogenicity, a nonspecific finding that is most commonly seen on the basis of steatosis in the absence of known liver disease. 2. There is a 4 x 5 mm sessile gallbladder polyp. No follow-up needed. 3. Otherwise unremarkable exam. Management of Incidentally Detected Gallbladder Polyps: Society of Radiologists in Ultrasound Consensus Conference Recommendations (published online February 04, 2021): [email or web address] Extremely low-risk polyps, i.e. pedunculated ball-on-the-wall or thin stalk: <9 mm: no follow-up 10-14 mm: follow-up at 6, 12 and 24 months >15 mm: surgical consult Low-risk polyps, i.e. pedunculated with a thick or wide stalk, or sessile: ?6 mm: no follow-up 7-9 mm follow-up ultrasound at 12 months 10-14 mm: follow-up ultrasound at 6, 12, 24, and 36 months vs surgical consult >15 mm: surgical  consult Intermediate risk: focal wall-thickening >46mm adjacent to polyp: <6 mm: follow-up at 6, 12, 24, 36 months vs surgical consult >7 mm: surgical consult Electronically Signed   By: Ree Molt M.D.   On: 03/04/2024 16:48     Procedures   Medications Ordered in the ED  doxycycline  (VIBRA -TABS) tablet 100 mg (has no administration in time range)                                    Medical Decision Making Amount and/or Complexity of Data Reviewed Labs: ordered. Radiology: ordered.  Risk Prescription drug management.   This patient presents to the ED with concern for trauma to chest wall/abdomen. This involves an extensive number of treatment options, and is a complaint that carries with  it a high risk of complications and morbidity.  The differential diagnosis includes rib fx vs underlying PNA vs biliary disease vs liver injury vs other  External records from outside source obtained and reviewed including UC records at [facility] - xray report with no abnormal findings  I ordered and personally interpreted labs.  The pertinent results include:  no emergent findings  I ordered imaging studies including RUQ ultrasound I independently visualized and interpreted imaging which showed no emergent findings; incidental GB polyp does not require specific follow up I agree with the radiologist interpretation  The patient was maintained on a cardiac monitor.  I personally viewed and interpreted the cardiac monitored which showed an underlying rhythm of: NSR  Patient was offered pain medicine but did not want any in the ED, including toradol /motrin  I have reviewed the patients home medicines and have made adjustments as needed  Test Considered: lower suspicion for intraabdominal bleeding, AAA, sepsis, acute PE - no indication for CT imaging at this time. Remainder of abdominal exam was soft/benign.  Doubt PTX.   This is most likely a rib fracture with possible pleurodynia.  Less likely  intraabdominal infection or emergency.  It's possible there is an early PNA developing, perhaps not well visualized on outpatient xray today.  We will start on doxycycline  for 1 week.  Discussed incentive spirometry.  OTC pain meds okay for home use.  She can follow up with her PCP this week.  She is comfortable with this plan.  Dispostion:  After consideration of the diagnostic results and the patients response to treatment, I feel that the patent would benefit from outpatient PCP follow up      Final diagnoses:  Rib pain  Gallbladder polyp    ED Discharge Orders          Ordered    doxycycline  (VIBRAMYCIN ) 100 MG capsule  2 times daily        03/04/24 1757    ondansetron  (ZOFRAN -ODT) 4 MG disintegrating tablet  Every 8 hours PRN        03/04/24 1757               Cottie Donnice PARAS, MD 03/04/24 1800

## 2024-03-04 NOTE — Discharge Instructions (Addendum)
 Your gallbladder polyp does not need any specific follow up.  It is considered incidental.  You were given antibiotics in the ER and prescribed at home to treat for POSSIBLE lung infection.  Your blood tests were otherwise reassuring in the ER.  It is very possible you have a rib fracture where you have pain on your chest wall.  You can take motrin and tylenol  at home, as needed, for pain.  Consider ice on the chest wall.  Rib fractures can take 4-8 weeks to heal.  Do not bind or wrap your chest.  Use the incentive spirometer device for the next 7 days, taking 10 slow breaths in at a time, 10 times per day.

## 2024-05-09 DIAGNOSIS — Z1231 Encounter for screening mammogram for malignant neoplasm of breast: Secondary | ICD-10-CM | POA: Diagnosis not present

## 2024-07-05 DIAGNOSIS — Z1212 Encounter for screening for malignant neoplasm of rectum: Secondary | ICD-10-CM | POA: Diagnosis not present

## 2024-07-05 DIAGNOSIS — E7849 Other hyperlipidemia: Secondary | ICD-10-CM | POA: Diagnosis not present
